# Patient Record
Sex: Male | Born: 1945 | Race: White | Hispanic: No | Marital: Married | Smoking: Former smoker
Health system: Southern US, Community
[De-identification: ages and names within clinical notes are randomized; demographics above are authoritative.]

## PROBLEM LIST (undated history)

## (undated) DIAGNOSIS — I10 Essential (primary) hypertension: Secondary | ICD-10-CM

## (undated) DIAGNOSIS — H269 Unspecified cataract: Secondary | ICD-10-CM

## (undated) DIAGNOSIS — F32A Depression, unspecified: Secondary | ICD-10-CM

## (undated) DIAGNOSIS — K219 Gastro-esophageal reflux disease without esophagitis: Secondary | ICD-10-CM

## (undated) DIAGNOSIS — J302 Other seasonal allergic rhinitis: Secondary | ICD-10-CM

## (undated) DIAGNOSIS — N189 Chronic kidney disease, unspecified: Secondary | ICD-10-CM

## (undated) DIAGNOSIS — N400 Enlarged prostate without lower urinary tract symptoms: Secondary | ICD-10-CM

## (undated) DIAGNOSIS — E785 Hyperlipidemia, unspecified: Secondary | ICD-10-CM

## (undated) DIAGNOSIS — F39 Unspecified mood [affective] disorder: Secondary | ICD-10-CM

## (undated) DIAGNOSIS — M1990 Unspecified osteoarthritis, unspecified site: Secondary | ICD-10-CM

## (undated) HISTORY — DX: Unspecified mood (affective) disorder: F39

## (undated) HISTORY — DX: Unspecified cataract: H26.9

## (undated) HISTORY — PX: TONSILLECTOMY: SUR1361

## (undated) HISTORY — PX: SHOULDER INJECTION: SHX5048

## (undated) HISTORY — DX: Hyperlipidemia, unspecified: E78.5

## (undated) HISTORY — DX: Benign prostatic hyperplasia without lower urinary tract symptoms: N40.0

## (undated) HISTORY — DX: Other seasonal allergic rhinitis: J30.2

## (undated) HISTORY — DX: Gastro-esophageal reflux disease without esophagitis: K21.9

## (undated) HISTORY — DX: Unspecified osteoarthritis, unspecified site: M19.90

## (undated) HISTORY — DX: Depression, unspecified: F32.A

## (undated) HISTORY — DX: Chronic kidney disease, unspecified: N18.9

---
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
## 2011-12-23 HISTORY — PX: COLONOSCOPY: SHX174

## 2014-07-11 DIAGNOSIS — H2513 Age-related nuclear cataract, bilateral: Secondary | ICD-10-CM | POA: Insufficient documentation

## 2015-08-02 DIAGNOSIS — Z23 Encounter for immunization: Secondary | ICD-10-CM | POA: Diagnosis not present

## 2015-10-04 DIAGNOSIS — H5203 Hypermetropia, bilateral: Secondary | ICD-10-CM | POA: Diagnosis not present

## 2015-10-04 DIAGNOSIS — H2513 Age-related nuclear cataract, bilateral: Secondary | ICD-10-CM | POA: Diagnosis not present

## 2015-10-04 DIAGNOSIS — H52203 Unspecified astigmatism, bilateral: Secondary | ICD-10-CM | POA: Diagnosis not present

## 2015-12-21 DIAGNOSIS — E7800 Pure hypercholesterolemia, unspecified: Secondary | ICD-10-CM | POA: Diagnosis not present

## 2015-12-21 DIAGNOSIS — K228 Other specified diseases of esophagus: Secondary | ICD-10-CM | POA: Diagnosis not present

## 2015-12-21 DIAGNOSIS — Z0000 Encounter for general adult medical examination without abnormal findings: Secondary | ICD-10-CM | POA: Diagnosis not present

## 2015-12-21 DIAGNOSIS — I10 Essential (primary) hypertension: Secondary | ICD-10-CM | POA: Diagnosis not present

## 2015-12-31 DIAGNOSIS — D225 Melanocytic nevi of trunk: Secondary | ICD-10-CM | POA: Diagnosis not present

## 2015-12-31 DIAGNOSIS — L821 Other seborrheic keratosis: Secondary | ICD-10-CM | POA: Diagnosis not present

## 2015-12-31 DIAGNOSIS — L812 Freckles: Secondary | ICD-10-CM | POA: Diagnosis not present

## 2015-12-31 DIAGNOSIS — D1801 Hemangioma of skin and subcutaneous tissue: Secondary | ICD-10-CM | POA: Diagnosis not present

## 2016-03-02 DIAGNOSIS — Z125 Encounter for screening for malignant neoplasm of prostate: Secondary | ICD-10-CM | POA: Diagnosis not present

## 2016-03-02 DIAGNOSIS — I10 Essential (primary) hypertension: Secondary | ICD-10-CM | POA: Diagnosis not present

## 2016-03-02 DIAGNOSIS — E784 Other hyperlipidemia: Secondary | ICD-10-CM | POA: Diagnosis not present

## 2016-03-05 DIAGNOSIS — Z0000 Encounter for general adult medical examination without abnormal findings: Secondary | ICD-10-CM | POA: Diagnosis not present

## 2016-03-05 DIAGNOSIS — M25552 Pain in left hip: Secondary | ICD-10-CM | POA: Diagnosis not present

## 2016-03-05 DIAGNOSIS — R809 Proteinuria, unspecified: Secondary | ICD-10-CM | POA: Diagnosis not present

## 2016-03-05 DIAGNOSIS — I10 Essential (primary) hypertension: Secondary | ICD-10-CM | POA: Diagnosis not present

## 2016-03-05 DIAGNOSIS — Z1389 Encounter for screening for other disorder: Secondary | ICD-10-CM | POA: Diagnosis not present

## 2016-03-05 DIAGNOSIS — R784 Finding of other drugs of addictive potential in blood: Secondary | ICD-10-CM | POA: Diagnosis not present

## 2016-03-05 DIAGNOSIS — G4700 Insomnia, unspecified: Secondary | ICD-10-CM | POA: Diagnosis not present

## 2016-03-05 DIAGNOSIS — E669 Obesity, unspecified: Secondary | ICD-10-CM | POA: Diagnosis not present

## 2016-03-05 DIAGNOSIS — R69 Illness, unspecified: Secondary | ICD-10-CM | POA: Diagnosis not present

## 2016-03-05 DIAGNOSIS — K219 Gastro-esophageal reflux disease without esophagitis: Secondary | ICD-10-CM | POA: Diagnosis not present

## 2016-05-07 DIAGNOSIS — R69 Illness, unspecified: Secondary | ICD-10-CM | POA: Diagnosis not present

## 2016-05-07 DIAGNOSIS — H353191 Nonexudative age-related macular degeneration, unspecified eye, early dry stage: Secondary | ICD-10-CM | POA: Diagnosis not present

## 2016-05-07 DIAGNOSIS — M25551 Pain in right hip: Secondary | ICD-10-CM | POA: Diagnosis not present

## 2016-06-30 DIAGNOSIS — R69 Illness, unspecified: Secondary | ICD-10-CM | POA: Diagnosis not present

## 2016-10-27 ENCOUNTER — Emergency Department (HOSPITAL_COMMUNITY): Payer: Medicare HMO

## 2016-10-27 ENCOUNTER — Encounter (HOSPITAL_COMMUNITY): Payer: Self-pay | Admitting: Emergency Medicine

## 2016-10-27 ENCOUNTER — Emergency Department (HOSPITAL_COMMUNITY)
Admission: EM | Admit: 2016-10-27 | Discharge: 2016-10-27 | Disposition: A | Discharge status: Home / Self Care | Payer: Medicare HMO | Attending: Emergency Medicine | Admitting: Emergency Medicine

## 2016-10-27 DIAGNOSIS — R1010 Upper abdominal pain, unspecified: Secondary | ICD-10-CM

## 2016-10-27 DIAGNOSIS — R1011 Right upper quadrant pain: Secondary | ICD-10-CM | POA: Diagnosis not present

## 2016-10-27 DIAGNOSIS — Z87891 Personal history of nicotine dependence: Secondary | ICD-10-CM | POA: Diagnosis not present

## 2016-10-27 DIAGNOSIS — R1030 Lower abdominal pain, unspecified: Secondary | ICD-10-CM | POA: Diagnosis present

## 2016-10-27 DIAGNOSIS — I10 Essential (primary) hypertension: Secondary | ICD-10-CM | POA: Diagnosis not present

## 2016-10-27 DIAGNOSIS — R1012 Left upper quadrant pain: Secondary | ICD-10-CM | POA: Diagnosis not present

## 2016-10-27 DIAGNOSIS — K5790 Diverticulosis of intestine, part unspecified, without perforation or abscess without bleeding: Secondary | ICD-10-CM | POA: Diagnosis not present

## 2016-10-27 HISTORY — DX: Essential (primary) hypertension: I10

## 2016-10-27 LAB — COMPREHENSIVE METABOLIC PANEL
ALBUMIN: 4.4 g/dL (ref 3.5–5.0)
ALK PHOS: 93 U/L (ref 38–126)
ALT: 46 U/L (ref 17–63)
AST: 26 U/L (ref 15–41)
Anion gap: 8 (ref 5–15)
BUN: 18 mg/dL (ref 6–20)
CO2: 26 mmol/L (ref 22–32)
Calcium: 9.5 mg/dL (ref 8.9–10.3)
Chloride: 104 mmol/L (ref 101–111)
Creatinine, Ser: 1.23 mg/dL (ref 0.61–1.24)
GFR calc Af Amer: >60 mL/min (ref >60)
GFR calc non Af Amer: 57 mL/min — ABNORMAL LOW (ref >60)
GLUCOSE: 101 mg/dL — AB (ref 65–99)
POTASSIUM: 4.2 mmol/L (ref 3.5–5.1)
SODIUM: 138 mmol/L (ref 135–145)
Total Bilirubin: 0.6 mg/dL (ref 0.3–1.2)
Total Protein: 7.5 g/dL (ref 6.5–8.1)

## 2016-10-27 LAB — CBC WITH DIFFERENTIAL/PLATELET
BASOS ABS: 0 10*3/uL (ref 0.0–0.1)
BASOS PCT: 0 %
EOS ABS: 0.2 10*3/uL (ref 0.0–0.7)
Eosinophils Relative: 2 %
HCT: 44.3 % (ref 39.0–52.0)
HEMOGLOBIN: 15.3 g/dL (ref 13.0–17.0)
Lymphocytes Relative: 22 %
Lymphs Abs: 1.9 10*3/uL (ref 0.7–4.0)
MCH: 30.7 pg (ref 26.0–34.0)
MCHC: 34.5 g/dL (ref 30.0–36.0)
MCV: 89 fL (ref 78.0–100.0)
MONOS PCT: 7 %
Monocytes Absolute: 0.6 10*3/uL (ref 0.1–1.0)
Neutro Abs: 5.9 10*3/uL (ref 1.7–7.7)
Neutrophils Relative %: 69 %
Platelets: 252 10*3/uL (ref 150–400)
RBC: 4.98 MIL/uL (ref 4.22–5.81)
RDW: 13.6 % (ref 11.5–15.5)
WBC: 8.5 10*3/uL (ref 4.0–10.5)

## 2016-10-27 LAB — LIPASE, BLOOD: Lipase: 27 U/L (ref 11–51)

## 2016-10-27 LAB — URINALYSIS, ROUTINE W REFLEX MICROSCOPIC
Bilirubin Urine: NEGATIVE
Glucose, UA: NEGATIVE mg/dL
Hgb urine dipstick: NEGATIVE
Ketones, ur: NEGATIVE mg/dL
LEUKOCYTES UA: NEGATIVE
Nitrite: NEGATIVE
PH: 5 (ref 5.0–8.0)
Protein, ur: NEGATIVE mg/dL
SPECIFIC GRAVITY, URINE: 1.009 (ref 1.005–1.030)

## 2016-10-27 LAB — I-STAT CHEM 8, ED
BUN: 20 mg/dL (ref 6–20)
CHLORIDE: 103 mmol/L (ref 101–111)
CREATININE: 1.2 mg/dL (ref 0.61–1.24)
Calcium, Ion: 1.15 mmol/L (ref 1.15–1.40)
Glucose, Bld: 101 mg/dL — ABNORMAL HIGH (ref 65–99)
HEMATOCRIT: 48 % (ref 39.0–52.0)
HEMOGLOBIN: 16.3 g/dL (ref 13.0–17.0)
POTASSIUM: 4.2 mmol/L (ref 3.5–5.1)
Sodium: 140 mmol/L (ref 135–145)
TCO2: 27 mmol/L (ref 0–100)

## 2016-10-27 MED ORDER — IOPAMIDOL (ISOVUE-300) INJECTION 61%
INTRAVENOUS | Status: AC
  Administered 2016-10-27: 100 mL via INTRAVENOUS
  Filled 2016-10-27: qty 100

## 2016-10-27 MED ORDER — IOPAMIDOL (ISOVUE-300) INJECTION 61%
100.0000 mL | Freq: Once | INTRAVENOUS | Status: AC | PRN
  Administered 2016-10-27: 100 mL via INTRAVENOUS

## 2016-10-27 MED ORDER — METOCLOPRAMIDE HCL 10 MG PO TABS
10.0000 mg | ORAL_TABLET | Freq: Once | ORAL | Status: AC
  Administered 2016-10-27: 10 mg via ORAL
  Filled 2016-10-27: qty 1

## 2016-10-27 MED ORDER — DICYCLOMINE HCL 10 MG PO CAPS
20.0000 mg | ORAL_CAPSULE | Freq: Once | ORAL | Status: AC
  Administered 2016-10-27: 20 mg via ORAL
  Filled 2016-10-27: qty 2

## 2016-10-27 NOTE — ED Triage Notes (Signed)
Pt states he is having abd pain that started last night  Pt states it is a dull, nagging, discomfort  Denies N/V/D  States it is worse when he lays down

## 2016-10-27 NOTE — ED Provider Notes (Signed)
[facility] DEPT Provider Note   CSN: [id number] Arrival date & time: 10/27/16  0343     History   Chief Complaint Chief Complaint  Patient presents with  . Abdominal Pain    HPI Bassel Rudisill is a 71 y.o. male with no significant past medical history presenting today with abdominal pain. Patient describes the pain as bilateral upper quadrants and a throbbing feeling.  There is no radiation to the pain. He has no nausea vomiting or diarrhea. He denies fevers. He has no sick contacts. He states he may have eaten some bad spaghetti last night. He denies any urinary symptoms. He took ranitidine and Pepto-Bismol prior to arrival without any relief of his symptoms. He has no further complaints.  10 Systems reviewed and are negative for acute change except as noted in the HPI.      HPI  Past Medical History:  Diagnosis Date  . Hypertension     There are no active problems to display for this patient.   Past Surgical History:  Procedure Laterality Date  . TONSILLECTOMY         Home Medications    Prior to Admission medications   Not on File    Family History History reviewed. No pertinent family history.  Social History Social History  Substance Use Topics  . Smoking status: Former Research scientist (life sciences)  . Smokeless tobacco: Never Used  . Alcohol use Yes     Comment: occ     Allergies   Patient has no known allergies.   Review of Systems Review of Systems   Physical Exam Updated Vital Signs BP 181/80 (BP Location: Left Arm)   Pulse 78   Temp 97.6 F (36.4 C) (Oral)   Resp 19   SpO2 98%   Physical Exam  Constitutional: He is oriented to person, place, and time. Vital signs are normal. He appears well-developed and well-nourished.  Non-toxic appearance. He does not appear ill. No distress.  HENT:  Head: Normocephalic and atraumatic.  Nose: Nose normal.  Mouth/Throat: Oropharynx is clear and moist. No oropharyngeal exudate.  Eyes: Conjunctivae and EOM are  normal. Pupils are equal, round, and reactive to light. No scleral icterus.  Neck: Normal range of motion. Neck supple. No tracheal deviation, no edema, no erythema and normal range of motion present. No thyroid mass and no thyromegaly present.  Cardiovascular: Normal rate, regular rhythm, S1 normal, S2 normal, normal heart sounds, intact distal pulses and normal pulses.  Exam reveals no gallop and no friction rub.   No murmur heard. Pulmonary/Chest: Effort normal and breath sounds normal. No respiratory distress. He has no wheezes. He has no rhonchi. He has no rales.  Abdominal: Soft. Normal appearance and bowel sounds are normal. He exhibits no distension, no ascites and no mass. There is no hepatosplenomegaly. There is no tenderness. There is no rebound, no guarding and no CVA tenderness.  Musculoskeletal: Normal range of motion. He exhibits no edema or tenderness.  Lymphadenopathy:    He has no cervical adenopathy.  Neurological: He is alert and oriented to person, place, and time. He has normal strength. No cranial nerve deficit or sensory deficit.  Skin: Skin is warm, dry and intact. No petechiae and no rash noted. He is not diaphoretic. No erythema. No pallor.  Nursing note and vitals reviewed.    ED Treatments / Results  Labs (all labs ordered are listed, but only abnormal results are displayed) Labs Reviewed  COMPREHENSIVE METABOLIC PANEL - Abnormal; Notable for the following:  Result Value   Glucose, Bld 101 (*)    GFR calc non Af Amer 57 (*)    All other components within normal limits  URINALYSIS, ROUTINE W REFLEX MICROSCOPIC - Abnormal; Notable for the following:    Color, Urine STRAW (*)    All other components within normal limits  I-STAT CHEM 8, ED - Abnormal; Notable for the following:    Glucose, Bld 101 (*)    All other components within normal limits  URINE CULTURE  CBC WITH DIFFERENTIAL/PLATELET  LIPASE, BLOOD    EKG  EKG Interpretation None        Radiology Ct Abdomen Pelvis W Contrast  Result Date: 10/27/2016 CLINICAL DATA:  All epigastric pain for several hours, positional. EXAM: CT ABDOMEN AND PELVIS WITH CONTRAST TECHNIQUE: Multidetector CT imaging of the abdomen and pelvis was performed using the standard protocol following bolus administration of intravenous contrast. CONTRAST:  100 mL Isovue-300 intravenous COMPARISON:  None. FINDINGS: Lower chest: No acute findings. 4.5 mm noncalcified nodule at the right middle lobe periphery, image 2 series 3. No consolidation or effusion. Hepatobiliary: No focal liver abnormality is seen. No gallstones, gallbladder wall thickening, or biliary dilatation. Pancreas: Unremarkable. No pancreatic ductal dilatation or surrounding inflammatory changes. Spleen: Normal in size without focal abnormality. Adrenals/Urinary Tract: Adrenal glands are unremarkable. Kidneys are normal, without renal calculi, focal lesion, or hydronephrosis. Bladder is unremarkable. Stomach/Bowel: Moderate hiatal hernia. Uncomplicated colonic diverticulosis. Small bowel and appendix are normal. Vascular/Lymphatic: The abdominal aorta is normal in caliber with moderate atherosclerotic calcification. Major branches of the aorta are patent. Reproductive: Unremarkable Other: No acute inflammatory changes.  No ascites. Musculoskeletal: No significant skeletal lesion. IMPRESSION: 1. No acute findings in the abdomen or pelvis. 2. Hiatal hernia 3. Uncomplicated diverticulosis 4. Aortic atherosclerosis. 5. Noncalcified 4.5 mm nodule at the periphery of the right middle lobe. No follow-up needed if patient is low-risk. Non-contrast chest CT can be considered in 12 months if patient is high-risk. This recommendation follows the consensus statement: Guidelines for Management of Incidental Pulmonary Nodules Detected on CT Images: From the Fleischner Society 2017; Radiology 2017; 284:228-243. Electronically Signed   By: Andreas Newport M.D.   On:  10/27/2016 06:15    Procedures Procedures (including critical care time)  Medications Ordered in ED Medications  dicyclomine (BENTYL) capsule 20 mg (20 mg Oral Given 10/27/16 0619)  metoCLOPramide (REGLAN) tablet 10 mg (10 mg Oral Given 10/27/16 0619)  iopamidol (ISOVUE-300) 61 % injection 100 mL (100 mLs Intravenous Contrast Given 10/27/16 0536)     Initial Impression / Assessment and Plan / ED Course  I have reviewed the triage vital signs and the nursing notes.  Pertinent labs & imaging results that were available during my care of the patient were reviewed by me and considered in my medical decision making (see chart for details).       Patient presents to the emergency department for abdominal pain. We'll obtain laboratory studies, CT scan for further evaluation. He was given Bentyl and Reglan. We'll continue to closely monitor.   Patient feels better after medications.  He has a GI doc he plans to call for fu.  He appears well and in NAD.  Labs and CT are unremarkable. He was told of incidental finding in lungs and need for fu.  VS remain within his normal limits and he is safe for DC   Final Clinical Impressions(s) / ED Diagnoses   Final diagnoses:  Pain of upper abdomen    New  Prescriptions New Prescriptions   No medications on file     Everlene Balls, MD 10/27/16 [phone]

## 2016-10-28 ENCOUNTER — Encounter: Payer: Self-pay | Admitting: Physician Assistant

## 2016-10-28 DIAGNOSIS — I10 Essential (primary) hypertension: Secondary | ICD-10-CM | POA: Diagnosis not present

## 2016-10-28 DIAGNOSIS — Z6831 Body mass index (BMI) 31.0-31.9, adult: Secondary | ICD-10-CM | POA: Diagnosis not present

## 2016-10-28 DIAGNOSIS — K219 Gastro-esophageal reflux disease without esophagitis: Secondary | ICD-10-CM | POA: Diagnosis not present

## 2016-10-28 LAB — URINE CULTURE

## 2016-11-02 DIAGNOSIS — I10 Essential (primary) hypertension: Secondary | ICD-10-CM | POA: Diagnosis not present

## 2016-11-02 DIAGNOSIS — Z6831 Body mass index (BMI) 31.0-31.9, adult: Secondary | ICD-10-CM | POA: Diagnosis not present

## 2016-11-02 DIAGNOSIS — B0229 Other postherpetic nervous system involvement: Secondary | ICD-10-CM | POA: Diagnosis not present

## 2016-11-09 ENCOUNTER — Ambulatory Visit: Payer: Medicare HMO | Admitting: Physician Assistant

## 2016-11-10 ENCOUNTER — Ambulatory Visit: Payer: Medicare HMO | Admitting: Physician Assistant

## 2016-12-08 DIAGNOSIS — R69 Illness, unspecified: Secondary | ICD-10-CM | POA: Diagnosis not present

## 2017-02-02 ENCOUNTER — Ambulatory Visit (INDEPENDENT_AMBULATORY_CARE_PROVIDER_SITE_OTHER): Payer: Medicare HMO

## 2017-02-02 ENCOUNTER — Ambulatory Visit (INDEPENDENT_AMBULATORY_CARE_PROVIDER_SITE_OTHER): Payer: Medicare HMO | Admitting: Physician Assistant

## 2017-02-02 DIAGNOSIS — S93411A Sprain of calcaneofibular ligament of right ankle, initial encounter: Secondary | ICD-10-CM

## 2017-02-02 NOTE — Progress Notes (Signed)
Office Visit Note   Patient: Michael Bishop           Date of Birth: March 27, 1946           MRN: [id number] Visit Date: 02/02/2017              Requested by: No referring provider defined for this encounter. PCP: System, Pcp Not In   Assessment & Plan: Visit Diagnoses:  1. Sprain of calcaneofibular ligament of right ankle, initial encounter     Plan: He will continue to use ice and ibuprofen. Relative rest. Did offer him an ASO brace he defers he will undergo and try to find a similar brace at the discount medical store. Like for him to wear the brace for the next 2 weeks whenever he is up ambulating. Then another week whenever he is outside ambulating. Operative appointment for follow-up in 2 weeks for recheck ascending call if he needed to be seen. Ask him to watch for increased mechanical symptoms and discussed these with him at length.  Follow-Up Instructions: Return if symptoms worsen or fail to improve.   Orders:  Orders Placed This Encounter  Procedures  . XR Ankle Complete Right   No orders of the defined types were placed in this encounter.     Procedures: No procedures performed   Clinical Data: No additional findings.   Subjective: No chief complaint on file.   HPI Mr. Veldhuizen is a 71 year old male were seen for right ankle pain. He seen Dr. Ninfa Linden in the past some 5 years ago for shoulder. He was leaving a friend's home yesterday when he turned back around to speak with him again and rolled his right ankle on the cement stoop. He denies any dizziness lightheadedness or other injury at the time. He is able to ambulate to his car and went actually shopping. Then once he got home he started developing some pain in the right ankle and swelling. He uses ibuprofen and ice which seemed to help. Review of Systems Negative for dizziness lightheadedness fevers chills. Please see history of present illness otherwise  Objective: Vital Signs: There were no vitals taken for  this visit.  Physical Exam  Constitutional: He is oriented to person, place, and time. He appears well-developed and well-nourished. No distress.  Neurological: He is alert and oriented to person, place, and time.  Psychiatric: He has a normal mood and affect. His behavior is normal.    Ortho Exam Right ankle has good dorsiflexion plantar flexion without pain. Good inversion eversion of the ankle without pain. Nontender over the posterior tibial tendon peroneal tendons. The Achilles is intact and nontender. Mild edema lateral aspect of the ankle. No ecchymosis erythema or cold or. Tenderness over the calcaneal fibular ligament only. Remainder of the ankles nontender. He has no tenderness over the proximal fibula. Right calf supple nontender. Dorsal pedal pulses present. Specialty Comments:  No specialty comments available.  Imaging: Xr Ankle Complete Right  Result Date: 02/02/2017 3 views right ankle: No acute fracture. Talus well located within the ankle mortise no diastases. No bony abnormalities otherwise.    PMFS History: There are no active problems to display for this patient.  Past Medical History:  Diagnosis Date  . Hypertension     No family history on file.  Past Surgical History:  Procedure Laterality Date  . TONSILLECTOMY     Social History   Occupational History  . Not on file.   Social History Main Topics  . Smoking  status: Former Research scientist (life sciences)  . Smokeless tobacco: Never Used  . Alcohol use Yes     Comment: occ  . Drug use: No  . Sexual activity: Not on file

## 2017-05-27 DIAGNOSIS — Z23 Encounter for immunization: Secondary | ICD-10-CM | POA: Diagnosis not present

## 2017-06-14 DIAGNOSIS — R69 Illness, unspecified: Secondary | ICD-10-CM | POA: Diagnosis not present

## 2017-09-03 DIAGNOSIS — I10 Essential (primary) hypertension: Secondary | ICD-10-CM | POA: Diagnosis not present

## 2017-09-03 DIAGNOSIS — R82998 Other abnormal findings in urine: Secondary | ICD-10-CM | POA: Diagnosis not present

## 2017-09-03 DIAGNOSIS — Z125 Encounter for screening for malignant neoplasm of prostate: Secondary | ICD-10-CM | POA: Diagnosis not present

## 2017-09-03 DIAGNOSIS — E7849 Other hyperlipidemia: Secondary | ICD-10-CM | POA: Diagnosis not present

## 2017-09-14 DIAGNOSIS — B0229 Other postherpetic nervous system involvement: Secondary | ICD-10-CM | POA: Diagnosis not present

## 2017-09-14 DIAGNOSIS — Z1389 Encounter for screening for other disorder: Secondary | ICD-10-CM | POA: Diagnosis not present

## 2017-09-14 DIAGNOSIS — G4709 Other insomnia: Secondary | ICD-10-CM | POA: Diagnosis not present

## 2017-09-14 DIAGNOSIS — I10 Essential (primary) hypertension: Secondary | ICD-10-CM | POA: Diagnosis not present

## 2017-09-14 DIAGNOSIS — R69 Illness, unspecified: Secondary | ICD-10-CM | POA: Diagnosis not present

## 2017-09-14 DIAGNOSIS — E7849 Other hyperlipidemia: Secondary | ICD-10-CM | POA: Diagnosis not present

## 2017-09-14 DIAGNOSIS — K219 Gastro-esophageal reflux disease without esophagitis: Secondary | ICD-10-CM | POA: Diagnosis not present

## 2017-09-14 DIAGNOSIS — K635 Polyp of colon: Secondary | ICD-10-CM | POA: Diagnosis not present

## 2017-09-14 DIAGNOSIS — E668 Other obesity: Secondary | ICD-10-CM | POA: Diagnosis not present

## 2017-09-14 DIAGNOSIS — Z0000 Encounter for general adult medical examination without abnormal findings: Secondary | ICD-10-CM | POA: Diagnosis not present

## 2017-10-19 DIAGNOSIS — Z6831 Body mass index (BMI) 31.0-31.9, adult: Secondary | ICD-10-CM | POA: Diagnosis not present

## 2017-10-19 DIAGNOSIS — I10 Essential (primary) hypertension: Secondary | ICD-10-CM | POA: Diagnosis not present

## 2017-11-23 ENCOUNTER — Encounter: Payer: Self-pay | Admitting: Internal Medicine

## 2017-12-20 DIAGNOSIS — R69 Illness, unspecified: Secondary | ICD-10-CM | POA: Diagnosis not present

## 2018-03-15 DIAGNOSIS — R69 Illness, unspecified: Secondary | ICD-10-CM | POA: Diagnosis not present

## 2018-03-15 DIAGNOSIS — E668 Other obesity: Secondary | ICD-10-CM | POA: Diagnosis not present

## 2018-03-15 DIAGNOSIS — Z6830 Body mass index (BMI) 30.0-30.9, adult: Secondary | ICD-10-CM | POA: Diagnosis not present

## 2018-03-15 DIAGNOSIS — K219 Gastro-esophageal reflux disease without esophagitis: Secondary | ICD-10-CM | POA: Diagnosis not present

## 2018-03-15 DIAGNOSIS — I10 Essential (primary) hypertension: Secondary | ICD-10-CM | POA: Diagnosis not present

## 2018-04-05 DIAGNOSIS — L821 Other seborrheic keratosis: Secondary | ICD-10-CM | POA: Diagnosis not present

## 2018-04-05 DIAGNOSIS — D2272 Melanocytic nevi of left lower limb, including hip: Secondary | ICD-10-CM | POA: Diagnosis not present

## 2018-04-05 DIAGNOSIS — D225 Melanocytic nevi of trunk: Secondary | ICD-10-CM | POA: Diagnosis not present

## 2018-04-05 DIAGNOSIS — L814 Other melanin hyperpigmentation: Secondary | ICD-10-CM | POA: Diagnosis not present

## 2018-04-05 DIAGNOSIS — D2372 Other benign neoplasm of skin of left lower limb, including hip: Secondary | ICD-10-CM | POA: Diagnosis not present

## 2018-04-05 DIAGNOSIS — D485 Neoplasm of uncertain behavior of skin: Secondary | ICD-10-CM | POA: Diagnosis not present

## 2018-05-17 DIAGNOSIS — I10 Essential (primary) hypertension: Secondary | ICD-10-CM | POA: Diagnosis not present

## 2018-05-17 DIAGNOSIS — Z23 Encounter for immunization: Secondary | ICD-10-CM | POA: Diagnosis not present

## 2018-05-17 DIAGNOSIS — Z6830 Body mass index (BMI) 30.0-30.9, adult: Secondary | ICD-10-CM | POA: Diagnosis not present

## 2018-06-02 ENCOUNTER — Ambulatory Visit: Payer: Medicare HMO | Admitting: Internal Medicine

## 2018-06-02 ENCOUNTER — Encounter: Payer: Self-pay | Admitting: Internal Medicine

## 2018-06-02 VITALS — BP 142/80 | HR 56 | Ht 70.0 in | Wt 217.8 lb

## 2018-06-02 DIAGNOSIS — E785 Hyperlipidemia, unspecified: Secondary | ICD-10-CM

## 2018-06-02 DIAGNOSIS — Z8249 Family history of ischemic heart disease and other diseases of the circulatory system: Secondary | ICD-10-CM | POA: Diagnosis not present

## 2018-06-02 DIAGNOSIS — I10 Essential (primary) hypertension: Secondary | ICD-10-CM | POA: Diagnosis not present

## 2018-06-02 NOTE — Patient Instructions (Signed)
Medication Instructions:  Continue current medications If you need a refill on your cardiac medications before your next appointment, please call your pharmacy.   Lab work: NONE  Testing/Procedures: Your physician has requested that you have a carotid duplex. This test is an ultrasound of the carotid arteries in your neck. It looks at blood flow through these arteries that supply the brain with blood. Allow one hour for this exam. There are no restrictions or special instructions. -- done @ Dr. Lysbeth Penner office  Follow-Up: as needed with Dr. Debara Pickett

## 2018-06-03 ENCOUNTER — Other Ambulatory Visit: Payer: Self-pay | Admitting: Internal Medicine

## 2018-06-03 DIAGNOSIS — R0989 Other specified symptoms and signs involving the circulatory and respiratory systems: Secondary | ICD-10-CM

## 2018-06-03 DIAGNOSIS — E785 Hyperlipidemia, unspecified: Secondary | ICD-10-CM

## 2018-06-03 DIAGNOSIS — Z8249 Family history of ischemic heart disease and other diseases of the circulatory system: Secondary | ICD-10-CM

## 2018-06-05 ENCOUNTER — Encounter: Payer: Self-pay | Admitting: Internal Medicine

## 2018-06-05 DIAGNOSIS — I10 Essential (primary) hypertension: Secondary | ICD-10-CM | POA: Insufficient documentation

## 2018-06-05 DIAGNOSIS — Z8249 Family history of ischemic heart disease and other diseases of the circulatory system: Secondary | ICD-10-CM | POA: Insufficient documentation

## 2018-06-05 DIAGNOSIS — E785 Hyperlipidemia, unspecified: Secondary | ICD-10-CM | POA: Insufficient documentation

## 2018-06-05 NOTE — Progress Notes (Signed)
OFFICE CONSULT NOTE  Chief Complaint:  Uncontrolled hypertension  Primary Care Physician: Prince Solian, MD  HPI:  Michael Bishop is a 72 y.o. male who is being seen today for the evaluation of uncontrolled hypertension at the request of Dr. Dagmar Hait. This is a 72 year old male with a history of hypertension and dyslipidemia as well as a family history of hypertension and his mother and father with hypertension and coronary artery disease status post CABG at age 58 who ultimately lived to age 77.  He also has a brother who had coronary artery bypass grafting at age 42.  He believes he is referred here for evaluation of uncontrolled hypertension.  Currently he is asymptomatic, denying any chest pain or worsening shortness of breath.  Blood pressure in the office today was 142/80.  Current medications include aspirin 81 mg daily, atorvastatin 80 mg nightly, telmisartan 80 mg daily he was recently switched to telmisartan from losartan, I believe for better duration of treatment.  He is concerned about persistent elevated diastolic blood pressure, however, it is again normal today.  PMHx:  Past Medical History:  Diagnosis Date  . Hypertension     Past Surgical History:  Procedure Laterality Date  . TONSILLECTOMY      FAMHx:  Family History  Problem Relation Age of Onset  . Hypertension Mother   . Hypertension Father   . Heart disease Father   . Heart disease Brother     SOCHx:   reports that he has quit smoking. He has never used smokeless tobacco. He reports that he drinks alcohol. He reports that he does not use drugs.  ALLERGIES:  No Known Allergies  ROS: Pertinent items noted in HPI and remainder of comprehensive ROS otherwise negative.  HOME MEDS: Current Outpatient Medications on File Prior to Visit  Medication Sig Dispense Refill  . Ascorbic Acid (VITAMIN C) 1000 MG tablet Take 1,000 mg by mouth.    Marland Kitchen aspirin 81 MG chewable tablet Chew 81 mg by mouth daily.     Marland Kitchen  atorvastatin (LIPITOR) 80 MG tablet     . Coenzyme Q10 100 MG capsule Take 100 mg by mouth.    . Multiple Vitamin (MULTIVITAMIN) capsule Take by mouth.    . telmisartan (MICARDIS) 80 MG tablet Take 80 mg by mouth daily.    . Vitamin D, Ergocalciferol, (DRISDOL) 50000 units CAPS capsule Take by mouth.     No current facility-administered medications on file prior to visit.     LABS/IMAGING: No results found for this or any previous visit (from the past 48 hour(s)). No results found.  LIPID PANEL: No results found for: CHOL, TRIG, HDL, CHOLHDL, VLDL, LDLCALC, LDLDIRECT  WEIGHTS: Wt Readings from Last 3 Encounters:  06/02/18 217 lb 12.8 oz (98.8 kg)    VITALS: BP (!) 142/80   Pulse (!) 56   Ht 5\' 10"  (1.778 m)   Wt 217 lb 12.8 oz (98.8 kg)   BMI 31.25 kg/m   EXAM: General appearance: alert and no distress Neck: no carotid bruit, no JVD and thyroid not enlarged, symmetric, no tenderness/mass/nodules Lungs: clear to auscultation bilaterally Heart: regular rate and rhythm, S1, S2 normal, no murmur, click, rub or gallop Abdomen: soft, non-tender; bowel sounds normal; no masses,  no organomegaly Extremities: extremities normal, atraumatic, no cyanosis or edema Pulses: 2+ and symmetric Skin: Skin color, texture, turgor normal. No rashes or lesions Neurologic: Grossly normal Psych: Pleasant  EKG: Sinus bradycardia 56- personally reviewed  ASSESSMENT: 1. Hypertension 2.  Dyslipidemia 3. Family history of coronary artery disease  PLAN: 1.   Mr. Angelillo has a history of hypertension, dyslipidemia and family history of coronary disease.  Recently was switched to telmisartan with improvement in blood pressure however he is not yet reach goal 120/80 or lower.  Blood pressure is much improved today and I recommended he continue to follow it at home and bring the readings into his PCPs office.  Should he need extra blood pressure control, he may consider donation telmisartan/HCTZ 80/12.5  mg daily.  He does have a family history of carotid artery disease.  Wishes to have screening carotid Dopplers today.  Although I did not detect bruit, this is a reasonable request.  We will go ahead and order that for him.  Plan follow-up with me as necessary.  Pixie Casino, MD, [facility], [place] Director of the Advanced Lipid Disorders &  Cardiovascular Risk Reduction Clinic Diplomate of the American Board of Clinical Lipidology Attending Cardiologist  Direct Dial: [phone]  Fax: [phone]  Website:  [email or web address] 06/05/2018, 4:00 PM

## 2018-06-06 ENCOUNTER — Ambulatory Visit (HOSPITAL_COMMUNITY)
Admission: RE | Admit: 2018-06-06 | Discharge: 2018-06-06 | Disposition: A | Discharge status: Home / Self Care | Payer: Medicare HMO | Source: Ambulatory Visit | Attending: Cardiovascular Disease | Admitting: Cardiovascular Disease

## 2018-06-06 DIAGNOSIS — Z8249 Family history of ischemic heart disease and other diseases of the circulatory system: Secondary | ICD-10-CM | POA: Insufficient documentation

## 2018-06-06 DIAGNOSIS — E785 Hyperlipidemia, unspecified: Secondary | ICD-10-CM | POA: Diagnosis not present

## 2018-06-06 DIAGNOSIS — R0989 Other specified symptoms and signs involving the circulatory and respiratory systems: Secondary | ICD-10-CM | POA: Insufficient documentation

## 2018-06-23 DIAGNOSIS — R69 Illness, unspecified: Secondary | ICD-10-CM | POA: Diagnosis not present

## 2018-07-13 IMAGING — CT CT ABD-PELV W/ CM
2 of 5 series · 16 of 46 positions shown, 18 images · IV contrast (ISOVUE)
Comparison: None.

CLINICAL DATA: All epigastric pain for several hours, positional.

EXAM:
CT ABDOMEN AND PELVIS WITH CONTRAST
TECHNIQUE: Multidetector CT imaging of the abdomen and pelvis was performed
using the standard protocol following bolus administration of
intravenous contrast.
CONTRAST:  100 mL Wsovue-5BB intravenous

[Series 2: abd/pel with · axial · 0.80mm/px · z∈[-562,-146]mm · 13 of 95 slices shown, 15 images]
[im 6/95  soft-tissue]
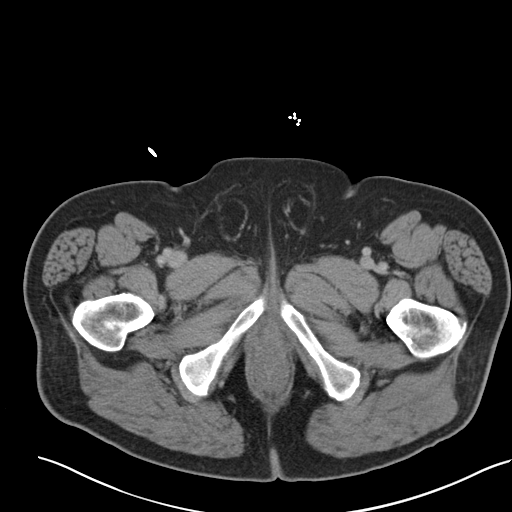
[im 6/95  bone]
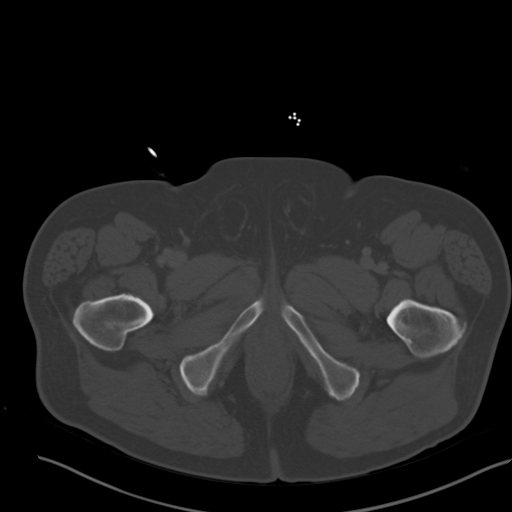
[im 12/95  soft-tissue]
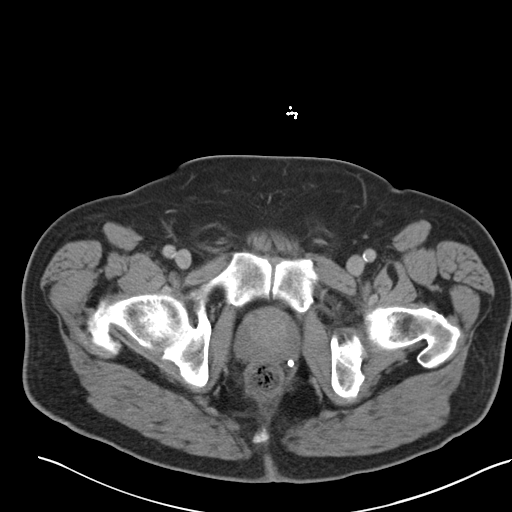
[im 18/95  soft-tissue]
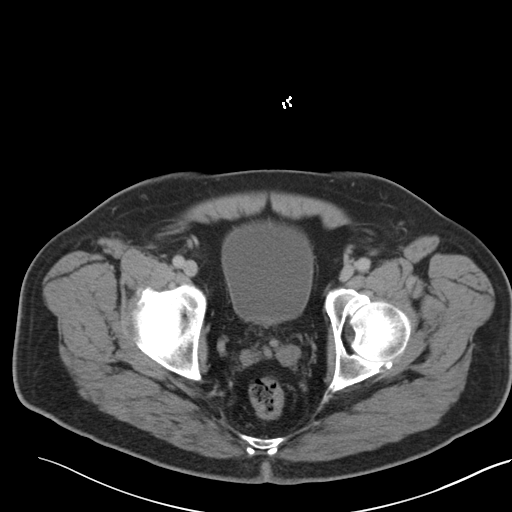
[im 30/95  soft-tissue]
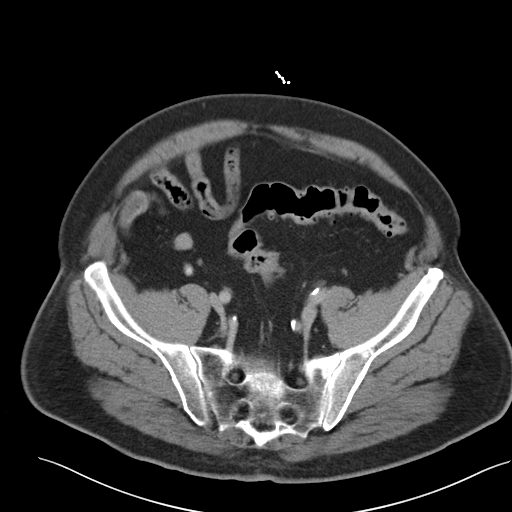
[im 36/95  soft-tissue]
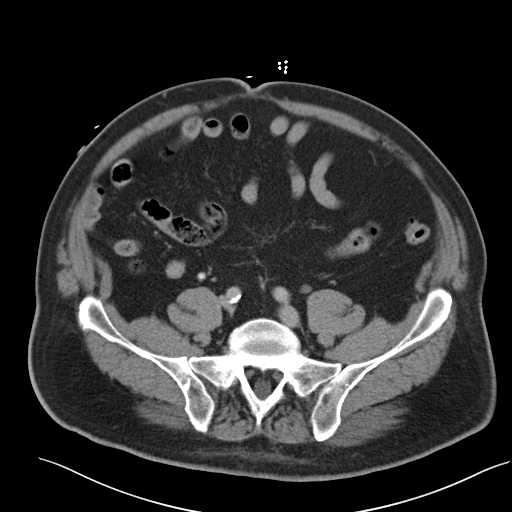
[im 42/95  soft-tissue]
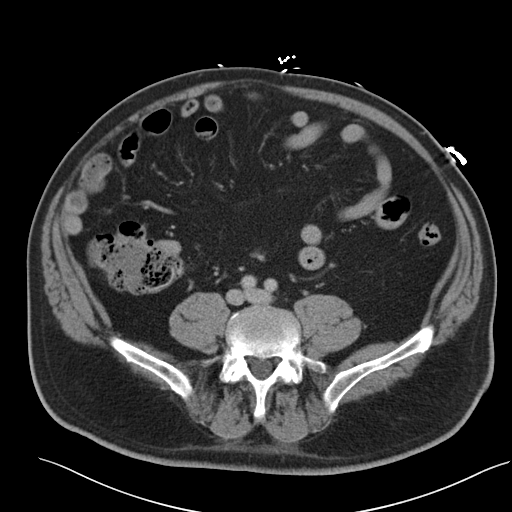
[im 48/95  soft-tissue]
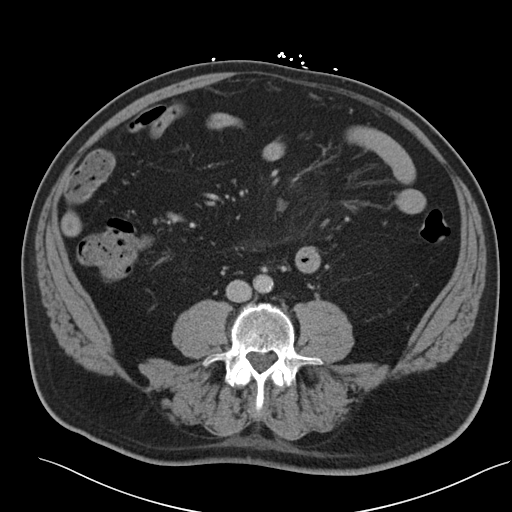
[im 53/95  soft-tissue]
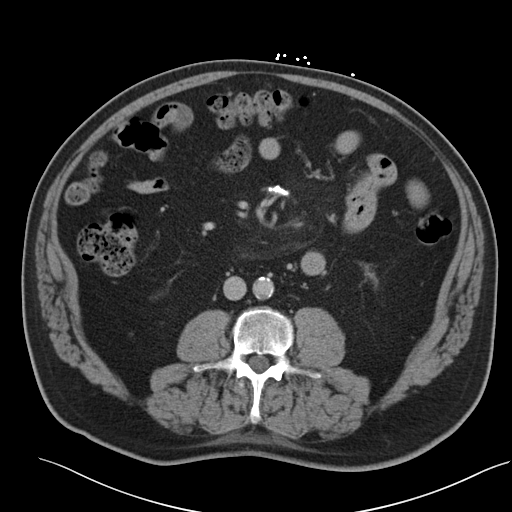
[im 59/95  soft-tissue]
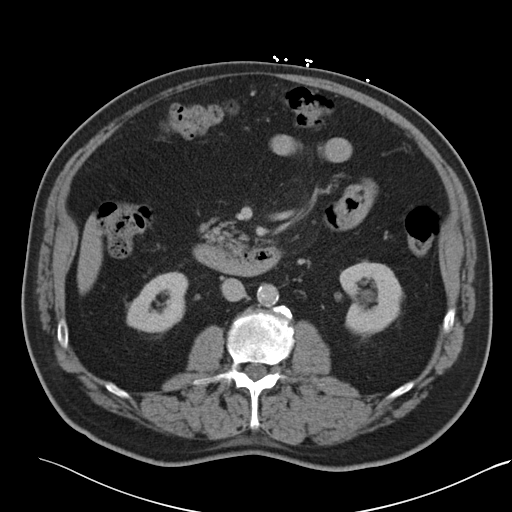
[im 59/95  bone]
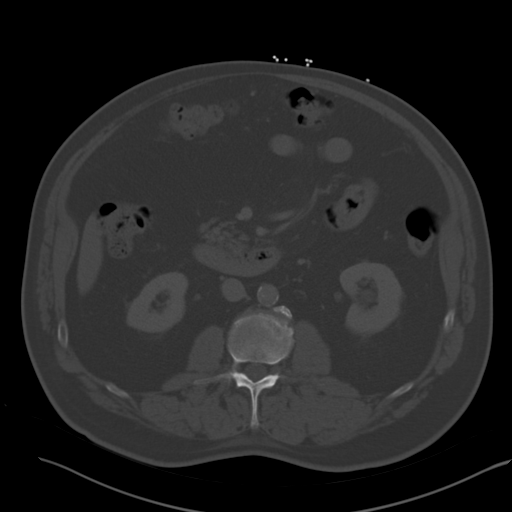
[im 65/95  soft-tissue]
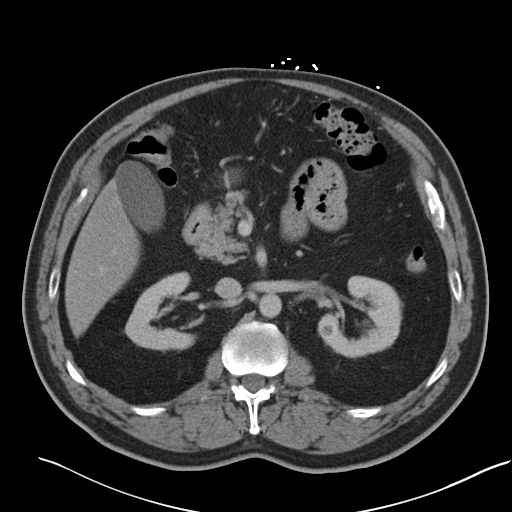
[im 77/95  soft-tissue]
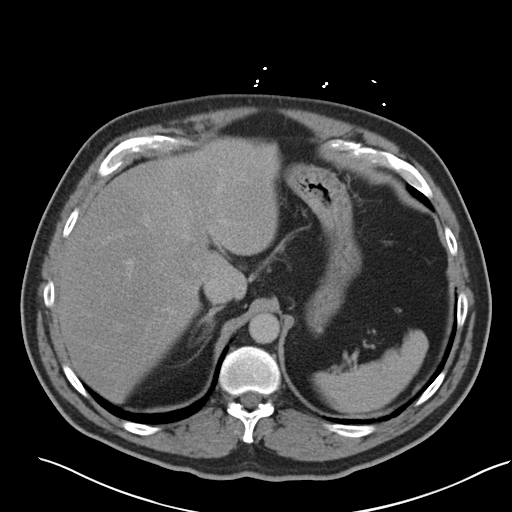
[im 83/95  soft-tissue]
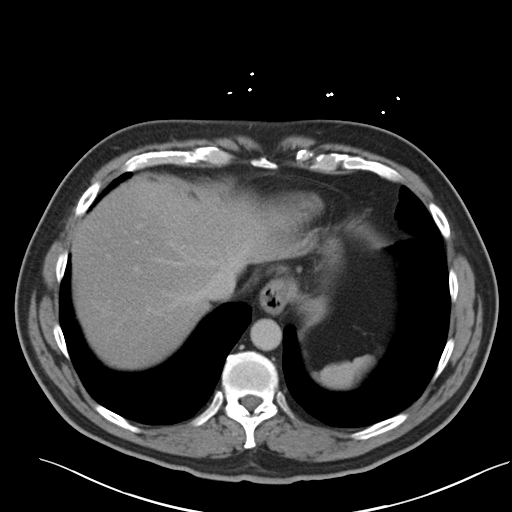
[im 89/95  soft-tissue]
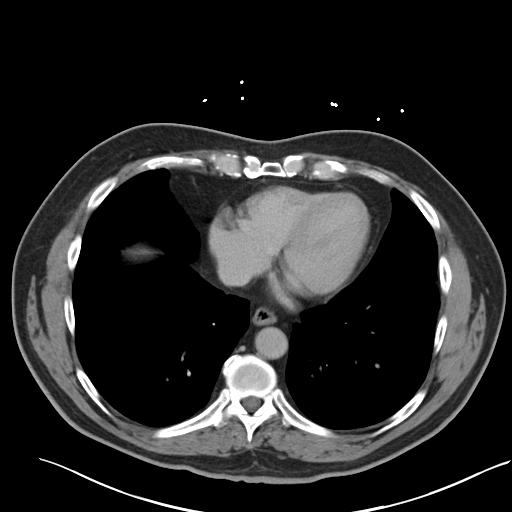

[Series 4: coronal a/|p · coronal · 0.74mm/px · 3 of 161 slices shown]
[im 54/161  soft-tissue]
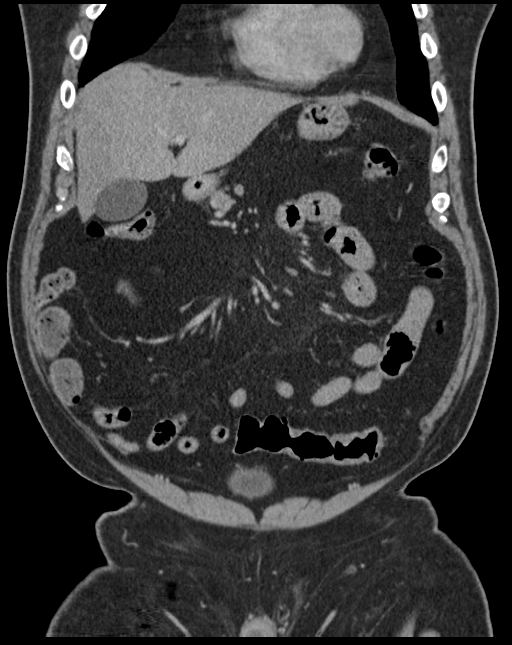
[im 72/161  soft-tissue]
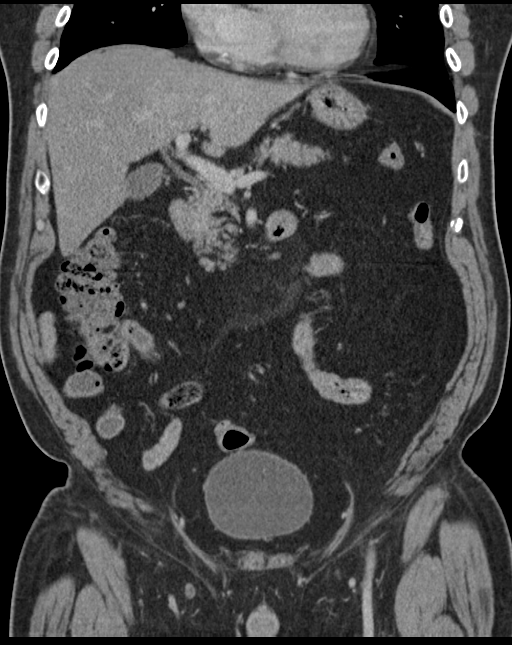
[im 89/161  soft-tissue]
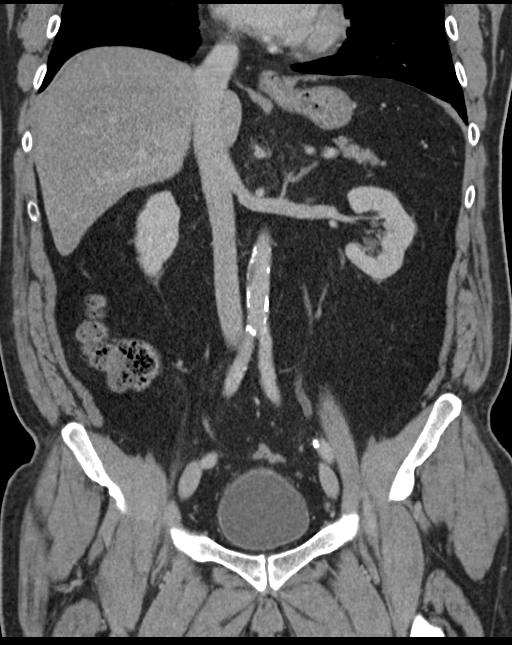

[16 of 46 positions shown; findings below may reference images not displayed]

FINDINGS: Lower chest: No acute findings. 4.5 mm noncalcified nodule at the
right middle lobe periphery, image 2 series 3. No consolidation or
effusion.

Hepatobiliary: No focal liver abnormality is seen. No gallstones,
gallbladder wall thickening, or biliary dilatation.

Pancreas: Unremarkable. No pancreatic ductal dilatation or
surrounding inflammatory changes.

Spleen: Normal in size without focal abnormality.

Adrenals/Urinary Tract: Adrenal glands are unremarkable. Kidneys are
normal, without renal calculi, focal lesion, or hydronephrosis.
Bladder is unremarkable.

Stomach/Bowel: Moderate hiatal hernia. Uncomplicated colonic
diverticulosis. Small bowel and appendix are normal.

Vascular/Lymphatic: The abdominal aorta is normal in caliber with
moderate atherosclerotic calcification. Major branches of the aorta
are patent.

Reproductive: Unremarkable

Other: No acute inflammatory changes.  No ascites.

Musculoskeletal: No significant skeletal lesion.
IMPRESSION: 1. No acute findings in the abdomen or pelvis.
2. Hiatal hernia
3. Uncomplicated diverticulosis
4. Aortic atherosclerosis.
5. Noncalcified 4.5 mm nodule at the periphery of the right middle
lobe. No follow-up needed if patient is low-risk. Non-contrast chest
CT can be considered in 12 months if patient is high-risk. This
recommendation follows the consensus statement: Guidelines for
Management of Incidental Pulmonary Nodules Detected on CT Images:

## 2018-09-22 DIAGNOSIS — R82998 Other abnormal findings in urine: Secondary | ICD-10-CM | POA: Diagnosis not present

## 2018-09-23 DIAGNOSIS — E7849 Other hyperlipidemia: Secondary | ICD-10-CM | POA: Diagnosis not present

## 2018-09-23 DIAGNOSIS — I10 Essential (primary) hypertension: Secondary | ICD-10-CM | POA: Diagnosis not present

## 2018-09-23 DIAGNOSIS — Z125 Encounter for screening for malignant neoplasm of prostate: Secondary | ICD-10-CM | POA: Diagnosis not present

## 2018-09-29 DIAGNOSIS — K219 Gastro-esophageal reflux disease without esophagitis: Secondary | ICD-10-CM | POA: Diagnosis not present

## 2018-09-29 DIAGNOSIS — E7849 Other hyperlipidemia: Secondary | ICD-10-CM | POA: Diagnosis not present

## 2018-09-29 DIAGNOSIS — M542 Cervicalgia: Secondary | ICD-10-CM | POA: Diagnosis not present

## 2018-09-29 DIAGNOSIS — Z0000 Encounter for general adult medical examination without abnormal findings: Secondary | ICD-10-CM | POA: Diagnosis not present

## 2018-09-29 DIAGNOSIS — E668 Other obesity: Secondary | ICD-10-CM | POA: Diagnosis not present

## 2018-09-29 DIAGNOSIS — R808 Other proteinuria: Secondary | ICD-10-CM | POA: Diagnosis not present

## 2018-09-29 DIAGNOSIS — R69 Illness, unspecified: Secondary | ICD-10-CM | POA: Diagnosis not present

## 2018-09-29 DIAGNOSIS — G4709 Other insomnia: Secondary | ICD-10-CM | POA: Diagnosis not present

## 2018-09-29 DIAGNOSIS — I10 Essential (primary) hypertension: Secondary | ICD-10-CM | POA: Diagnosis not present

## 2018-09-29 DIAGNOSIS — N183 Chronic kidney disease, stage 3 (moderate): Secondary | ICD-10-CM | POA: Diagnosis not present

## 2018-11-01 DIAGNOSIS — I10 Essential (primary) hypertension: Secondary | ICD-10-CM | POA: Diagnosis not present

## 2018-11-03 DIAGNOSIS — I10 Essential (primary) hypertension: Secondary | ICD-10-CM | POA: Diagnosis not present

## 2019-02-16 DIAGNOSIS — R69 Illness, unspecified: Secondary | ICD-10-CM | POA: Diagnosis not present

## 2019-03-30 DIAGNOSIS — R69 Illness, unspecified: Secondary | ICD-10-CM | POA: Diagnosis not present

## 2019-03-30 DIAGNOSIS — K219 Gastro-esophageal reflux disease without esophagitis: Secondary | ICD-10-CM | POA: Diagnosis not present

## 2019-03-30 DIAGNOSIS — N183 Chronic kidney disease, stage 3 (moderate): Secondary | ICD-10-CM | POA: Diagnosis not present

## 2019-03-30 DIAGNOSIS — I129 Hypertensive chronic kidney disease with stage 1 through stage 4 chronic kidney disease, or unspecified chronic kidney disease: Secondary | ICD-10-CM | POA: Diagnosis not present

## 2019-03-30 DIAGNOSIS — M62838 Other muscle spasm: Secondary | ICD-10-CM | POA: Diagnosis not present

## 2019-03-30 DIAGNOSIS — Z1331 Encounter for screening for depression: Secondary | ICD-10-CM | POA: Diagnosis not present

## 2019-03-30 DIAGNOSIS — R809 Proteinuria, unspecified: Secondary | ICD-10-CM | POA: Diagnosis not present

## 2019-03-30 DIAGNOSIS — E669 Obesity, unspecified: Secondary | ICD-10-CM | POA: Diagnosis not present

## 2019-04-17 DIAGNOSIS — H02831 Dermatochalasis of right upper eyelid: Secondary | ICD-10-CM | POA: Diagnosis not present

## 2019-04-17 DIAGNOSIS — H2513 Age-related nuclear cataract, bilateral: Secondary | ICD-10-CM | POA: Diagnosis not present

## 2019-04-17 DIAGNOSIS — H02834 Dermatochalasis of left upper eyelid: Secondary | ICD-10-CM | POA: Diagnosis not present

## 2019-04-28 DIAGNOSIS — H524 Presbyopia: Secondary | ICD-10-CM | POA: Diagnosis not present

## 2019-04-28 DIAGNOSIS — H52223 Regular astigmatism, bilateral: Secondary | ICD-10-CM | POA: Diagnosis not present

## 2019-06-12 ENCOUNTER — Other Ambulatory Visit: Payer: Self-pay | Admitting: Podiatry

## 2019-06-12 ENCOUNTER — Ambulatory Visit: Payer: Medicare HMO | Admitting: Podiatry

## 2019-06-12 ENCOUNTER — Ambulatory Visit (INDEPENDENT_AMBULATORY_CARE_PROVIDER_SITE_OTHER): Payer: Medicare HMO

## 2019-06-12 ENCOUNTER — Other Ambulatory Visit: Payer: Self-pay

## 2019-06-12 ENCOUNTER — Encounter: Payer: Self-pay | Admitting: Podiatry

## 2019-06-12 DIAGNOSIS — M216X1 Other acquired deformities of right foot: Secondary | ICD-10-CM

## 2019-06-12 DIAGNOSIS — M79671 Pain in right foot: Secondary | ICD-10-CM

## 2019-06-12 DIAGNOSIS — L84 Corns and callosities: Secondary | ICD-10-CM | POA: Diagnosis not present

## 2019-06-12 NOTE — Progress Notes (Signed)
Subjective:   Patient ID: Michael Bishop, male   DOB: 73 y.o.   MRN: [id number]   HPI Patient presents stating that he has had lesions on the bottom of his right foot which have been going on for around a year and are sore at different times.  Does not remember specific injury but states they have become more intense over time.  Patient does not smoke likes to be active   Review of Systems  All other systems reviewed and are negative.       Objective:  Physical Exam Vitals signs and nursing note reviewed.  Constitutional:      Appearance: He is well-developed.  Pulmonary:     Effort: Pulmonary effort is normal.  Musculoskeletal: Normal range of motion.  Skin:    General: Skin is warm.  Neurological:     Mental Status: He is alert.     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  Patient is encouraged to call with questions concerns  X-rays indicate that there is some hypertrophy of the bone structure but no indications of any advanced arthritis or stress fracture right

## 2019-06-23 DIAGNOSIS — R69 Illness, unspecified: Secondary | ICD-10-CM | POA: Diagnosis not present

## 2019-08-23 ENCOUNTER — Other Ambulatory Visit: Payer: Self-pay

## 2019-08-23 DIAGNOSIS — Z20822 Contact with and (suspected) exposure to covid-19: Secondary | ICD-10-CM

## 2019-08-23 DIAGNOSIS — Z20828 Contact with and (suspected) exposure to other viral communicable diseases: Secondary | ICD-10-CM | POA: Diagnosis not present

## 2019-08-24 LAB — NOVEL CORONAVIRUS, NAA: SARS-CoV-2, NAA: NOT DETECTED

## 2019-09-21 DIAGNOSIS — R69 Illness, unspecified: Secondary | ICD-10-CM | POA: Diagnosis not present

## 2019-09-22 ENCOUNTER — Ambulatory Visit: Payer: Medicare HMO

## 2019-09-27 ENCOUNTER — Ambulatory Visit: Payer: Medicare HMO

## 2019-09-28 ENCOUNTER — Ambulatory Visit: Payer: Medicare HMO | Attending: Internal Medicine

## 2019-09-28 DIAGNOSIS — Z23 Encounter for immunization: Secondary | ICD-10-CM | POA: Insufficient documentation

## 2019-09-28 NOTE — Progress Notes (Signed)
   Covid-19 Vaccination Clinic  Name:  Michael Bishop    MRN: [id number] DOB: 08/13/1946  09/28/2019  [name] was observed post Covid-19 immunization for 15 minutes without incidence. He was provided with Vaccine Information Sheet and instruction to access the V-Safe system.   [name] was instructed to call 911 with any severe reactions post vaccine: Marland Kitchen Difficulty breathing  . Swelling of your face and throat  . A fast heartbeat  . A bad rash all over your body  . Dizziness and weakness    Immunizations Administered    Name Date Dose VIS Date Route   Pfizer COVID-19 Vaccine 09/28/2019  2:55 PM 0.3 mL 08/04/2019 Intramuscular   Manufacturer: [place]   Lot: [id number]   [place]: [id number]

## 2019-10-23 ENCOUNTER — Ambulatory Visit: Payer: Medicare HMO | Attending: Internal Medicine

## 2019-10-23 DIAGNOSIS — Z23 Encounter for immunization: Secondary | ICD-10-CM

## 2019-10-23 NOTE — Progress Notes (Signed)
   Covid-19 Vaccination Clinic  Name:  Michael Bishop    MRN: [id number] DOB: Sep 28, 1945  10/23/2019  [name] was observed post Covid-19 immunization for 15 minutes without incidence. He was provided with Vaccine Information Sheet and instruction to access the V-Safe system.   [name] was instructed to call 911 with any severe reactions post vaccine: Marland Kitchen Difficulty breathing  . Swelling of your face and throat  . A fast heartbeat  . A bad rash all over your body  . Dizziness and weakness    Immunizations Administered    Name Date Dose VIS Date Route   Pfizer COVID-19 Vaccine 10/23/2019  5:15 PM 0.3 mL 08/04/2019 Intramuscular   Manufacturer: [place]   Lot: [id number]   [place]: [id number]

## 2019-12-13 DIAGNOSIS — H8110 Benign paroxysmal vertigo, unspecified ear: Secondary | ICD-10-CM | POA: Diagnosis not present

## 2019-12-13 DIAGNOSIS — R69 Illness, unspecified: Secondary | ICD-10-CM | POA: Diagnosis not present

## 2019-12-13 DIAGNOSIS — I129 Hypertensive chronic kidney disease with stage 1 through stage 4 chronic kidney disease, or unspecified chronic kidney disease: Secondary | ICD-10-CM | POA: Diagnosis not present

## 2019-12-13 DIAGNOSIS — N1831 Chronic kidney disease, stage 3a: Secondary | ICD-10-CM | POA: Diagnosis not present

## 2020-03-18 DIAGNOSIS — R69 Illness, unspecified: Secondary | ICD-10-CM | POA: Diagnosis not present

## 2020-03-27 DIAGNOSIS — Z008 Encounter for other general examination: Secondary | ICD-10-CM | POA: Diagnosis not present

## 2020-03-27 DIAGNOSIS — R69 Illness, unspecified: Secondary | ICD-10-CM | POA: Diagnosis not present

## 2020-03-27 DIAGNOSIS — I10 Essential (primary) hypertension: Secondary | ICD-10-CM | POA: Diagnosis not present

## 2020-03-27 DIAGNOSIS — Z87891 Personal history of nicotine dependence: Secondary | ICD-10-CM | POA: Diagnosis not present

## 2020-03-27 DIAGNOSIS — Z8249 Family history of ischemic heart disease and other diseases of the circulatory system: Secondary | ICD-10-CM | POA: Diagnosis not present

## 2020-03-27 DIAGNOSIS — E669 Obesity, unspecified: Secondary | ICD-10-CM | POA: Diagnosis not present

## 2020-03-27 DIAGNOSIS — Z6830 Body mass index (BMI) 30.0-30.9, adult: Secondary | ICD-10-CM | POA: Diagnosis not present

## 2020-03-27 DIAGNOSIS — Z7982 Long term (current) use of aspirin: Secondary | ICD-10-CM | POA: Diagnosis not present

## 2020-03-27 DIAGNOSIS — Z803 Family history of malignant neoplasm of breast: Secondary | ICD-10-CM | POA: Diagnosis not present

## 2020-03-27 DIAGNOSIS — E785 Hyperlipidemia, unspecified: Secondary | ICD-10-CM | POA: Diagnosis not present

## 2020-05-07 ENCOUNTER — Other Ambulatory Visit: Payer: Self-pay

## 2020-05-07 ENCOUNTER — Ambulatory Visit: Payer: Medicare HMO | Admitting: Internal Medicine

## 2020-05-07 ENCOUNTER — Encounter: Payer: Self-pay | Admitting: Internal Medicine

## 2020-05-07 VITALS — BP 148/80 | HR 59 | Ht 70.0 in | Wt 217.8 lb

## 2020-05-07 DIAGNOSIS — R0989 Other specified symptoms and signs involving the circulatory and respiratory systems: Secondary | ICD-10-CM | POA: Diagnosis not present

## 2020-05-07 DIAGNOSIS — I10 Essential (primary) hypertension: Secondary | ICD-10-CM | POA: Diagnosis not present

## 2020-05-07 DIAGNOSIS — R0602 Shortness of breath: Secondary | ICD-10-CM

## 2020-05-07 NOTE — Progress Notes (Signed)
OFFICE CONSULT NOTE  Chief Complaint:  Follow-up, dyspnea  Primary Care Physician: Prince Solian, MD  HPI:  Michael Bishop is a 74 y.o. male who is being seen today for the evaluation of uncontrolled hypertension at the request of Dr. Dagmar Hait. This is a 74 year old male with a history of hypertension and dyslipidemia as well as a family history of hypertension and his mother and father with hypertension and coronary artery disease status post CABG at age 24 who ultimately lived to age 51.  He also has a brother who had coronary artery bypass grafting at age 47.  He believes he is referred here for evaluation of uncontrolled hypertension.  Currently he is asymptomatic, denying any chest pain or worsening shortness of breath.  Blood pressure in the office today was 142/80.  Current medications include aspirin 81 mg daily, atorvastatin 80 mg nightly, telmisartan 80 mg daily he was recently switched to telmisartan from losartan, I believe for better duration of treatment.  He is concerned about persistent elevated diastolic blood pressure, however, it is again normal today.  05/07/2020  Michael Bishop returns today for follow-up.  In general his blood pressure was elevated today however home readings seem to be a little better than this.  He has reported some dyspnea and at times some fatigue.  He denies any positional dizziness, however there has been evidence for diastolic hypotension with diastolic blood pressures in the 40s and 50s at times at home.  Systolics are in the 751 range giving a wide pulse pressure.  Today in the office however the blood pressure was elevated but the pulse pressure was more normal.  PMHx:  Past Medical History:  Diagnosis Date  . Hypertension     Past Surgical History:  Procedure Laterality Date  . TONSILLECTOMY      FAMHx:  Family History  Problem Relation Age of Onset  . Hypertension Mother   . Hypertension Father   . Heart disease Father   . Heart disease  Brother     SOCHx:   reports that he has quit smoking. He has never used smokeless tobacco. He reports current alcohol use. He reports that he does not use drugs.  ALLERGIES:  No Known Allergies  ROS: Pertinent items noted in HPI and remainder of comprehensive ROS otherwise negative.  HOME MEDS: Current Outpatient Medications on File Prior to Visit  Medication Sig Dispense Refill  . Ascorbic Acid (VITAMIN C) 1000 MG tablet Take 1,000 mg by mouth.    Marland Kitchen aspirin 81 MG chewable tablet Chew 81 mg by mouth daily.     Marland Kitchen atorvastatin (LIPITOR) 80 MG tablet     . Coenzyme Q10 100 MG capsule Take 100 mg by mouth.    . Multiple Vitamin (MULTIVITAMIN) capsule Take by mouth.    . Omega-3 Fatty Acids (FISH OIL) 1000 MG CAPS Take 1 capsule by mouth. daily    . Specialty Vitamins Products (MAGNESIUM, AMINO ACID CHELATE,) 133 MG tablet Take by mouth daily. 1 tablet daily    . telmisartan (MICARDIS) 80 MG tablet Take 80 mg by mouth daily.    . Vitamin D, Ergocalciferol, (DRISDOL) 50000 units CAPS capsule Take by mouth.     No current facility-administered medications on file prior to visit.    LABS/IMAGING: No results found for this or any previous visit (from the past 48 hour(s)). No results found.  LIPID PANEL: No results found for: CHOL, TRIG, HDL, CHOLHDL, VLDL, LDLCALC, LDLDIRECT  WEIGHTS: Wt Readings from Last  3 Encounters:  05/07/20 217 lb 12.8 oz (98.8 kg)  06/02/18 217 lb 12.8 oz (98.8 kg)    VITALS: BP (!) 148/80   Pulse (!) 59   Ht 5\' 10"  (1.778 m)   Wt 217 lb 12.8 oz (98.8 kg)   SpO2 97%   BMI 31.25 kg/m   EXAM: General appearance: alert and no distress Neck: no carotid bruit, no JVD and thyroid not enlarged, symmetric, no tenderness/mass/nodules Lungs: clear to auscultation bilaterally Heart: regular rate and rhythm, S1, S2 normal, no murmur, click, rub or gallop Abdomen: soft, non-tender; bowel sounds normal; no masses,  no organomegaly Extremities: extremities  normal, atraumatic, no cyanosis or edema Pulses: 2+ and symmetric Skin: Skin color, texture, turgor normal. No rashes or lesions Neurologic: Grossly normal Psych: Pleasant  EKG: Sinus bradycardia, nonspecific ST and T wave changes at 59-personally reviewed  ASSESSMENT: 1. Dyspnea with low diastolic blood pressures 2. Hypertension 3. Dyslipidemia 4. Family history of coronary artery disease  PLAN: 1.   Michael Bishop has recently had some notable dyspnea with low diastolic blood pressures.  His daughter was concerned about cardiac causes of this such as ischemia although he does not have any anginal symptoms.  He has risk factors for this.  His wide pulse pressure could suggest valvular heart disease such as aortic insufficiency or cardiomyopathy.  I would like to get an echo to further evaluate this.  We may need to consider backing off a little on his medications due to some symptoms which could be related to low diastolic blood pressure.  Pixie Casino, MD, [facility], [place] Director of the Advanced Lipid Disorders &  Cardiovascular Risk Reduction Clinic Diplomate of the American Board of Clinical Lipidology Attending Cardiologist  Direct Dial: [phone]  Fax: [phone]  Website:  [email or web address] 05/07/2020, 2:24 PM

## 2020-05-07 NOTE — Patient Instructions (Signed)
Medication Instructions:  Your physician recommends that you continue on your current medications as directed. Please refer to the Current Medication list given to you today.  *If you need a refill on your cardiac medications before your next appointment, please call your pharmacy*   Testing/Procedures: Echocardiogram @ [address] - 3rd Floor   Follow-Up: At [facility], you and your health needs are our priority.  As part of our continuing mission to provide you with exceptional heart care, we have created designated Provider Care Teams.  These Care Teams include your primary Cardiologist (physician) and Advanced Practice Providers (APPs -  Physician Assistants and Nurse Practitioners) who all work together to provide you with the care you need, when you need it.  We recommend signing up for the patient portal called "MyChart".  Sign up information is provided on this After Visit Summary.  MyChart is used to connect with patients for Virtual Visits (Telemedicine).  Patients are able to view lab/test results, encounter notes, upcoming appointments, etc.  Non-urgent messages can be sent to your provider as well.   To learn more about what you can do with MyChart, go to [email or web address].    Your next appointment:   3-4 month(s)  The format for your next appointment:   In Person  Provider:   You may see Dr. Debara Pickett or one of the following Advanced Practice Providers on your designated Care Team:    Almyra Deforest, PA-C  Fabian Sharp, [place] or   Roby Lofts, [place]    Other Instructions

## 2020-05-16 ENCOUNTER — Ambulatory Visit (HOSPITAL_COMMUNITY): Payer: Medicare HMO | Attending: Internal Medicine

## 2020-05-16 ENCOUNTER — Other Ambulatory Visit: Payer: Self-pay

## 2020-05-16 DIAGNOSIS — R0602 Shortness of breath: Secondary | ICD-10-CM | POA: Insufficient documentation

## 2020-05-17 LAB — ECHOCARDIOGRAM COMPLETE
Area-P 1/2: 3.42 cm2
S' Lateral: 3.3 cm

## 2020-07-02 DIAGNOSIS — Z125 Encounter for screening for malignant neoplasm of prostate: Secondary | ICD-10-CM | POA: Diagnosis not present

## 2020-07-02 DIAGNOSIS — E785 Hyperlipidemia, unspecified: Secondary | ICD-10-CM | POA: Diagnosis not present

## 2020-07-09 DIAGNOSIS — K219 Gastro-esophageal reflux disease without esophagitis: Secondary | ICD-10-CM | POA: Diagnosis not present

## 2020-07-09 DIAGNOSIS — R809 Proteinuria, unspecified: Secondary | ICD-10-CM | POA: Diagnosis not present

## 2020-07-09 DIAGNOSIS — N1831 Chronic kidney disease, stage 3a: Secondary | ICD-10-CM | POA: Diagnosis not present

## 2020-07-09 DIAGNOSIS — I129 Hypertensive chronic kidney disease with stage 1 through stage 4 chronic kidney disease, or unspecified chronic kidney disease: Secondary | ICD-10-CM | POA: Diagnosis not present

## 2020-07-09 DIAGNOSIS — E785 Hyperlipidemia, unspecified: Secondary | ICD-10-CM | POA: Diagnosis not present

## 2020-07-09 DIAGNOSIS — Z0000 Encounter for general adult medical examination without abnormal findings: Secondary | ICD-10-CM | POA: Diagnosis not present

## 2020-07-09 DIAGNOSIS — R69 Illness, unspecified: Secondary | ICD-10-CM | POA: Diagnosis not present

## 2020-07-09 DIAGNOSIS — R82998 Other abnormal findings in urine: Secondary | ICD-10-CM | POA: Diagnosis not present

## 2020-07-09 DIAGNOSIS — I10 Essential (primary) hypertension: Secondary | ICD-10-CM | POA: Diagnosis not present

## 2020-07-09 DIAGNOSIS — G4700 Insomnia, unspecified: Secondary | ICD-10-CM | POA: Diagnosis not present

## 2020-07-09 DIAGNOSIS — E669 Obesity, unspecified: Secondary | ICD-10-CM | POA: Diagnosis not present

## 2020-07-09 DIAGNOSIS — K635 Polyp of colon: Secondary | ICD-10-CM | POA: Diagnosis not present

## 2020-08-05 DIAGNOSIS — H903 Sensorineural hearing loss, bilateral: Secondary | ICD-10-CM | POA: Diagnosis not present

## 2020-09-04 ENCOUNTER — Telehealth: Payer: Self-pay

## 2020-09-04 ENCOUNTER — Telehealth (INDEPENDENT_AMBULATORY_CARE_PROVIDER_SITE_OTHER): Payer: Medicare HMO | Admitting: Internal Medicine

## 2020-09-04 ENCOUNTER — Encounter: Payer: Self-pay | Admitting: Internal Medicine

## 2020-09-04 VITALS — BP 136/58 | HR 56 | Ht 70.0 in | Wt 216.0 lb

## 2020-09-04 DIAGNOSIS — R0989 Other specified symptoms and signs involving the circulatory and respiratory systems: Secondary | ICD-10-CM | POA: Diagnosis not present

## 2020-09-04 DIAGNOSIS — R0602 Shortness of breath: Secondary | ICD-10-CM

## 2020-09-04 DIAGNOSIS — R2681 Unsteadiness on feet: Secondary | ICD-10-CM | POA: Diagnosis not present

## 2020-09-04 DIAGNOSIS — I10 Essential (primary) hypertension: Secondary | ICD-10-CM | POA: Diagnosis not present

## 2020-09-04 MED ORDER — TELMISARTAN 40 MG PO TABS: 40.0000 mg | ORAL_TABLET | Freq: Every day | ORAL | 3 refills | Status: DC

## 2020-09-04 NOTE — Telephone Encounter (Signed)
Patient called to review e-visit instructions.  Patient aware that the following changes have been made: DECREASE TELMISARTAN TO 40mg  DAILY  Advised patient to monitor blood pressure daily at home and to call back to office with any issues or concerns.   Staff message sent to Scheduling for a 3 month follow up visit.   Patient verbalized understanding of all instructions.   No further assistance needed at this time.

## 2020-09-04 NOTE — Patient Instructions (Signed)
Medication Instructions:  DECREASE TELMISARTAN TO 40mg  DAILY- A NEW PRESCRIPTION HAS BEEN SENT IN FOR 40MG  TABLETS- PLEASE TAKE 1 TABLET DAILY  *If you need a refill on your cardiac medications before your next appointment, please call your pharmacy*  Follow-Up: At [facility], you and your health needs are our priority.  As part of our continuing mission to provide you with exceptional heart care, we have created designated Provider Care Teams.  These Care Teams include your primary Cardiologist (physician) and Advanced Practice Providers (APPs -  Physician Assistants and Nurse Practitioners) who all work together to provide you with the care you need, when you need it.  Your next appointment:   3 months   The format for your next appointment:   In Person  Provider:   K. Mali Hilty, MD  Other Instructions  PLEASE MONITOR BLOOD PRESSURE DAILY, AND WRITE THESE READINGS DOWN. PLEASE CALL THE OFFICE WITH ANY QUESTIONS OR CONCERNS [phone]

## 2020-09-04 NOTE — Progress Notes (Signed)
Virtual Visit via Video Note   This visit type was conducted due to national recommendations for restrictions regarding the COVID-19 Pandemic (e.g. social distancing) in an effort to limit this patient's exposure and mitigate transmission in our community.  Due to his co-morbid illnesses, this patient is at least at moderate risk for complications without adequate follow up.  This format is felt to be most appropriate for this patient at this time.  All issues noted in this document were discussed and addressed.  A limited physical exam was performed with this format.  Please refer to the patient's chart for his consent to telehealth for [facility].      Date:  09/04/2020   ID:  Michael Bishop, DOB 1946/03/31, MRN [id number] The patient was identified using 2 identifiers.  Evaluation Performed:  Follow-Up Visit  Patient Location:  [place] [address]  Provider location:   [address], [place], [place] [address]  PCP:  Prince Solian, MD  Cardiologist:  Pixie Casino, MD Electrophysiologist:  None   Chief Complaint:  Follow-up dizziness  History of Present Illness:    Michael Bishop is a 75 y.o. male who presents via audio/video conferencing for a telehealth visit today.  his is a 75 year old male with a history of hypertension and dyslipidemia as well as a family history of hypertension and his mother and father with hypertension and coronary artery disease status post CABG at age 70 who ultimately lived to age 85.  Michael Bishop also has a brother who had coronary artery bypass grafting at age 59.  Michael Bishop believes Michael Bishop is referred here for evaluation of uncontrolled hypertension.  Currently Michael Bishop is asymptomatic, denying any chest pain or worsening shortness of breath.  Blood pressure in the office today was 142/80.  Current medications include aspirin 81 mg daily, atorvastatin 80 mg nightly, telmisartan 80 mg daily Michael Bishop was recently switched to telmisartan from  losartan, I believe for better duration of treatment.  Michael Bishop is concerned about persistent elevated diastolic blood pressure, however, it is again normal today.  05/07/2020  Michael Bishop returns today for follow-up.  In general his blood pressure was elevated today however home readings seem to be a little better than this.  Michael Bishop has reported some dyspnea and at times some fatigue.  Michael Bishop denies any positional dizziness, however there has been evidence for diastolic hypotension with diastolic blood pressures in the 40s and 50s at times at home.  Systolics are in the [id number] range giving a wide pulse pressure.  Today in the office however the blood pressure was elevated but the pulse pressure was more normal.  09/04/2020  Michael Bishop returns today for video follow-up. Michael Bishop has had some recurrent episodes of unsteadiness and presyncope. Diastolic BP has been low, but is in the upper 50's today.   The patient does not have symptoms concerning for COVID-19 infection (fever, chills, cough, or new SHORTNESS OF BREATH).    Prior CV studies:   The following studies were reviewed today:  Chart reviewed  PMHx:  Past Medical History:  Diagnosis Date  . Hypertension     Past Surgical History:  Procedure Laterality Date  . TONSILLECTOMY      FAMHx:  Family History  Problem Relation Age of Onset  . Hypertension Mother   . Hypertension Father   . Heart disease Father   . Heart disease Brother     SOCHx:   reports that Michael Bishop has quit smoking. Michael Bishop has never  used smokeless tobacco. Michael Bishop reports current alcohol use. Michael Bishop reports that Michael Bishop does not use drugs.  ALLERGIES:  No Known Allergies  MEDS:  Current Meds  Medication Sig  . Ascorbic Acid (VITAMIN C) 1000 MG tablet Take 1,000 mg by mouth.  Marland Kitchen aspirin 81 MG chewable tablet Chew 81 mg by mouth daily.   Marland Kitchen atorvastatin (LIPITOR) 80 MG tablet   . Coenzyme Q10 100 MG capsule Take 100 mg by mouth.  . Multiple Vitamin (MULTIVITAMIN) capsule Take by mouth.  . Omega-3  Fatty Acids (FISH OIL) 1000 MG CAPS Take 1 capsule by mouth. daily  . Specialty Vitamins Products (MAGNESIUM, AMINO ACID CHELATE,) 133 MG tablet Take by mouth daily. 1 tablet daily  . Vitamin D, Ergocalciferol, (DRISDOL) 50000 units CAPS capsule Take by mouth.  . [DISCONTINUED] telmisartan (MICARDIS) 80 MG tablet Take 80 mg by mouth daily.     ROS: Pertinent items noted in HPI and remainder of comprehensive ROS otherwise negative.  Labs/Other Tests and Data Reviewed:    Recent Labs: No results found for requested labs within last 8760 hours.   Recent Lipid Panel No results found for: CHOL, TRIG, HDL, CHOLHDL, LDLCALC, LDLDIRECT  Wt Readings from Last 3 Encounters:  09/04/20 216 lb (98 kg)  05/07/20 217 lb 12.8 oz (98.8 kg)  06/02/18 217 lb 12.8 oz (98.8 kg)     Exam:    Vital Signs:  BP (!) 136/58   Pulse (!) 56   Ht 5\' 10"  (1.778 m)   Wt 216 lb (98 kg)   BMI 30.99 kg/m    General appearance: alert and no distress Lungs: no visual respiratory difficulty Abdomen: soft, non-tender; bowel sounds normal; no masses,  no organomegaly Extremities: extremities normal, atraumatic, no cyanosis or edema Skin: Skin color, texture, turgor normal. No rashes or lesions Neurologic: Mental status: Alert, oriented, thought content appropriate Psych: Pleasant  ASSESSMENT & PLAN:    1. Dyspnea with low diastolic blood pressures 2. Hypertension 3. Dyslipidemia 4. Family history of coronary artery disease 5. Unsteady gait  Michael Bishop has had a somewhat unsteady gait and perhaps some presyncope with complaints of shortness of breath however Michael Bishop says those have improved.  Michael Bishop had an echo which showed normal systolic and diastolic function.  It may be that Michael Bishop is on a little too much blood pressure medication.  I advised decreasing his telmisartan 80 to 40 mg daily.  Michael Bishop should continue to monitor blood pressures and for any changes in his symptoms.  COVID-19 Education: The signs and symptoms of  COVID-19 were discussed with the patient and how to seek care for testing (follow up with PCP or arrange E-visit).  The importance of social distancing was discussed today.  Patient Risk:   After full review of this patients clinical status, I feel that they are at least moderate risk at this time.  Time:   Today, I have spent 25 minutes with the patient with telehealth technology discussing hypertension, dyslipidemia, low diastolic blood pressure, unsteady gait.     Medication Adjustments/Labs and Tests Ordered: Current medicines are reviewed at length with the patient today.  Concerns regarding medicines are outlined above.   Tests Ordered: No orders of the defined types were placed in this encounter.   Medication Changes: Meds ordered this encounter  Medications  . telmisartan (MICARDIS) 40 MG tablet    Sig: Take 1 tablet (40 mg total) by mouth daily.    Dispense:  30 tablet    Refill:  3    Disposition:  in 6 month(s)  Pixie Casino, MD, [facility], [place] Director of the Advanced Lipid Disorders &  Cardiovascular Risk Reduction Clinic Diplomate of the American Board of Clinical Lipidology Attending Cardiologist  Direct Dial: [phone]  Fax: [phone]  Website:  [email or web address]  Pixie Casino, MD  09/04/2020 5:25 PM

## 2020-11-20 DIAGNOSIS — H524 Presbyopia: Secondary | ICD-10-CM | POA: Diagnosis not present

## 2020-11-20 DIAGNOSIS — H2513 Age-related nuclear cataract, bilateral: Secondary | ICD-10-CM | POA: Diagnosis not present

## 2020-11-20 DIAGNOSIS — H5203 Hypermetropia, bilateral: Secondary | ICD-10-CM | POA: Diagnosis not present

## 2020-11-20 DIAGNOSIS — H52203 Unspecified astigmatism, bilateral: Secondary | ICD-10-CM | POA: Diagnosis not present

## 2020-12-05 ENCOUNTER — Ambulatory Visit: Payer: Medicare HMO | Admitting: Internal Medicine

## 2021-01-08 ENCOUNTER — Ambulatory Visit (INDEPENDENT_AMBULATORY_CARE_PROVIDER_SITE_OTHER): Payer: Medicare HMO | Admitting: Internal Medicine

## 2021-01-08 ENCOUNTER — Encounter: Payer: Self-pay | Admitting: Internal Medicine

## 2021-01-08 ENCOUNTER — Other Ambulatory Visit: Payer: Self-pay

## 2021-01-08 VITALS — BP 122/62 | HR 53 | Ht 70.0 in | Wt 217.4 lb

## 2021-01-08 DIAGNOSIS — I10 Essential (primary) hypertension: Secondary | ICD-10-CM | POA: Diagnosis not present

## 2021-01-08 DIAGNOSIS — R0989 Other specified symptoms and signs involving the circulatory and respiratory systems: Secondary | ICD-10-CM

## 2021-01-08 DIAGNOSIS — R0602 Shortness of breath: Secondary | ICD-10-CM | POA: Diagnosis not present

## 2021-01-08 NOTE — Patient Instructions (Signed)

## 2021-01-08 NOTE — Progress Notes (Signed)
OFFICE CONSULT NOTE  Chief Complaint:  Follow-up  Primary Care Physician: Prince Solian, MD  HPI:  Michael Bishop is a 75 y.o. male who is being seen today for the evaluation of uncontrolled hypertension at the request of Dr. Dagmar Hait. This is a 75 year old male with a history of hypertension and dyslipidemia as well as a family history of hypertension and his mother and father with hypertension and coronary artery disease status post CABG at age 91 who ultimately lived to age 74.  He also has a brother who had coronary artery bypass grafting at age 74.  He believes he is referred here for evaluation of uncontrolled hypertension.  Currently he is asymptomatic, denying any chest pain or worsening shortness of breath.  Blood pressure in the office today was 142/80.  Current medications include aspirin 81 mg daily, atorvastatin 80 mg nightly, telmisartan 80 mg daily he was recently switched to telmisartan from losartan, I believe for better duration of treatment.  He is concerned about persistent elevated diastolic blood pressure, however, it is again normal today.  05/07/2020  Michael Bishop returns today for follow-up.  In general his blood pressure was elevated today however home readings seem to be a little better than this.  He has reported some dyspnea and at times some fatigue.  He denies any positional dizziness, however there has been evidence for diastolic hypotension with diastolic blood pressures in the 40s and 50s at times at home.  Systolics are in the 244 range giving a wide pulse pressure.  Today in the office however the blood pressure was elevated but the pulse pressure was more normal.  01/08/2021  Michael Bishop returns today for follow-up.  I saw him via virtual visit.  He was having some fatigue and shortness of breath.  He felt like his blood pressure was kind of low.  I recommended decreasing his telmisartan from 80 to 40 mg daily.  He said that change was effective for him.  Today blood  pressure is excellent 122/62.  Heart rates in the 50s.  He denies any shortness of breath or chest discomfort.  He said he was able to walk a long distance yesterday without any issues.  PMHx:  Past Medical History:  Diagnosis Date  . Hypertension     Past Surgical History:  Procedure Laterality Date  . TONSILLECTOMY      FAMHx:  Family History  Problem Relation Age of Onset  . Hypertension Mother   . Hypertension Father   . Heart disease Father   . Heart disease Brother     SOCHx:   reports that he has quit smoking. He has never used smokeless tobacco. He reports current alcohol use. He reports that he does not use drugs.  ALLERGIES:  No Known Allergies  ROS: Pertinent items noted in HPI and remainder of comprehensive ROS otherwise negative.  HOME MEDS: Current Outpatient Medications on File Prior to Visit  Medication Sig Dispense Refill  . Ascorbic Acid (VITAMIN C) 1000 MG tablet Take 1,000 mg by mouth.    Marland Kitchen aspirin 81 MG chewable tablet Chew 81 mg by mouth daily.     Marland Kitchen atorvastatin (LIPITOR) 80 MG tablet     . Coenzyme Q10 100 MG capsule Take 100 mg by mouth 2 (two) times daily.    . Multiple Vitamin (MULTIVITAMIN) capsule Take by mouth.    . Omega-3 Fatty Acids (FISH OIL) 1000 MG CAPS Take 1 capsule by mouth. daily    . Specialty Vitamins  Products (MAGNESIUM, AMINO ACID CHELATE,) 133 MG tablet Take by mouth daily. 1 tablet daily    . telmisartan (MICARDIS) 40 MG tablet Take 1 tablet (40 mg total) by mouth daily. 30 tablet 3  . Vitamin D, Ergocalciferol, (DRISDOL) 50000 units CAPS capsule Take by mouth.     No current facility-administered medications on file prior to visit.    LABS/IMAGING: No results found for this or any previous visit (from the past 48 hour(s)). No results found.  LIPID PANEL: No results found for: CHOL, TRIG, HDL, CHOLHDL, VLDL, LDLCALC, LDLDIRECT  WEIGHTS: Wt Readings from Last 3 Encounters:  01/08/21 217 lb 6.4 oz (98.6 kg)  09/04/20  216 lb (98 kg)  05/07/20 217 lb 12.8 oz (98.8 kg)    VITALS: BP 122/62   Pulse (!) 53   Ht 5\' 10"  (1.778 m)   Wt 217 lb 6.4 oz (98.6 kg)   SpO2 98%   BMI 31.19 kg/m   EXAM: General appearance: alert and no distress Neck: no carotid bruit, no JVD and thyroid not enlarged, symmetric, no tenderness/mass/nodules Lungs: clear to auscultation bilaterally Heart: regular rate and rhythm, S1, S2 normal, no murmur, click, rub or gallop Abdomen: soft, non-tender; bowel sounds normal; no masses,  no organomegaly Extremities: extremities normal, atraumatic, no cyanosis or edema Pulses: 2+ and symmetric Skin: Skin color, texture, turgor normal. No rashes or lesions Neurologic: Grossly normal Psych: Pleasant  EKG: Sinus bradycardia 53, nonspecific ST changes-personally reviewed   ASSESSMENT: 1. Dyspnea 2. Hypertension 3. Dyslipidemia 4. Family history of coronary artery disease  PLAN: 1.   Michael Bishop has had improvement in his diastolic blood pressures after lowering his telmisartan.  The blood pressure appears to be good today.  He says his symptoms have improved and his exercise tolerance is better.  No further changes at this time to his medicines and plan follow-up with me annually or sooner as necessary.  Pixie Casino, MD, [facility], [place] Director of the Advanced Lipid Disorders &  Cardiovascular Risk Reduction Clinic Diplomate of the American Board of Clinical Lipidology Attending Cardiologist  Direct Dial: [phone]  Fax: [phone]  Website:  [email or web address] 01/08/2021, 9:45 AM

## 2021-01-10 DIAGNOSIS — R69 Illness, unspecified: Secondary | ICD-10-CM | POA: Diagnosis not present

## 2021-01-10 DIAGNOSIS — R413 Other amnesia: Secondary | ICD-10-CM | POA: Diagnosis not present

## 2021-01-10 DIAGNOSIS — Z029 Encounter for administrative examinations, unspecified: Secondary | ICD-10-CM | POA: Diagnosis not present

## 2021-01-10 DIAGNOSIS — E785 Hyperlipidemia, unspecified: Secondary | ICD-10-CM | POA: Diagnosis not present

## 2021-01-10 DIAGNOSIS — I129 Hypertensive chronic kidney disease with stage 1 through stage 4 chronic kidney disease, or unspecified chronic kidney disease: Secondary | ICD-10-CM | POA: Diagnosis not present

## 2021-01-10 DIAGNOSIS — N1831 Chronic kidney disease, stage 3a: Secondary | ICD-10-CM | POA: Diagnosis not present

## 2021-01-23 ENCOUNTER — Other Ambulatory Visit: Payer: Self-pay | Admitting: Internal Medicine

## 2021-01-28 ENCOUNTER — Encounter: Payer: Self-pay | Admitting: *Deleted

## 2021-01-30 ENCOUNTER — Ambulatory Visit: Payer: Medicare HMO | Admitting: Neurology

## 2021-01-30 ENCOUNTER — Encounter: Payer: Self-pay | Admitting: Neurology

## 2021-01-30 VITALS — BP 154/67 | HR 62 | Ht 70.0 in | Wt 220.0 lb

## 2021-01-30 DIAGNOSIS — R413 Other amnesia: Secondary | ICD-10-CM | POA: Diagnosis not present

## 2021-01-30 DIAGNOSIS — F988 Other specified behavioral and emotional disorders with onset usually occurring in childhood and adolescence: Secondary | ICD-10-CM | POA: Diagnosis not present

## 2021-01-30 DIAGNOSIS — R69 Illness, unspecified: Secondary | ICD-10-CM | POA: Diagnosis not present

## 2021-01-30 DIAGNOSIS — Z636 Dependent relative needing care at home: Secondary | ICD-10-CM | POA: Insufficient documentation

## 2021-01-30 MED ORDER — ESCITALOPRAM OXALATE 10 MG PO TABS: 10.0000 mg | ORAL_TABLET | Freq: Every day | ORAL | 1 refills | Status: DC

## 2021-01-30 NOTE — Addendum Note (Signed)
Addended by: Gildardo Griffes on: 01/30/2021 05:14 PM   Modules accepted: Orders

## 2021-01-30 NOTE — Patient Instructions (Signed)
There are well-accepted and sensible ways to reduce risk for Alzheimers disease and other degenerative brain disorders .  Exercise Daily Walk A daily 20 minute walk should be part of your routine. Disease related apathy can be a significant roadblock to exercise and the only way to overcome this is to make it a daily routine and perhaps have a reward at the end (something your loved one loves to eat or drink perhaps) or a personal trainer coming to the home can also be very useful. Most importantly, the patient is much more likely to exercise if the caregiver / spouse does it with him/her. In general a structured, repetitive schedule is best.  General Health: Any diseases which effect your body will effect your brain such as a pneumonia, urinary infection, blood clot, heart attack or stroke. Keep contact with your primary care doctor for regular follow ups.  Sleep. A good nights sleep is healthy for the brain. Seven hours is recommended. If you have insomnia or poor sleep habits we can give you some instructions. If you have sleep apnea wear your mask.  Diet: Eating a heart healthy diet is also a good idea; fish and poultry instead of red meat, nuts (mostly non-peanuts), vegetables, fruits, olive oil or canola oil (instead of butter), minimal salt (use other spices to flavor foods), whole grain rice, bread, cereal and pasta and wine in moderation.Research is now showing that the MIND diet, which is a combination of The Mediterranean diet and the DASH diet, is beneficial for cognitive processing and longevity. Information about this diet can be found in The MIND Diet, a book by Maggie Moon, MS, RDN, and online at https://www.healthline.com/nutrition/mind-diet  Finances, Power of Attorney and Advance Directives: You should consider putting legal safeguards in place with regard to financial and medical decision making. While the spouse always has power of attorney for medical and financial issues in the  absence of any form, you should consider what you want in case the spouse / caregiver is no longer around or capable of making decisions.   The Alzheimers Association Position on Disease Prevention  Can Alzheimer's be prevented? It's a question that continues to intrigue researchers and fuel new investigations. There are no clear-cut answers yet -- partially due to the need for more large-scale studies in diverse populations -- but promising research is under way. The Alzheimer's Association is leading the worldwide effort to find a treatment for Alzheimer's, delay its onset and prevent it from developing.   What causes Alzheimer's? Experts agree that in the vast majority of cases, Alzheimer's, like other common chronic conditions, probably develops as a result of complex interactions among multiple factors, including age, genetics, environment, lifestyle and coexisting medical conditions. Although some risk factors -- such as age or genes -- cannot be changed, other risk factors -- such as high blood pressure and lack of exercise -- usually can be changed to help reduce risk. Research in these areas may lead to new ways to detect those at highest risk.  Prevention studies A small percentage of people with Alzheimer's disease (less than 1 percent) have an early-onset type associated with genetic mutations. Individuals who have these genetic mutations are guaranteed to develop the disease. An ongoing clinical trial conducted by the [facility] ([facility]), is testing whether antibodies to beta-amyloid can reduce the accumulation of beta-amyloid plaque in the brains of people with such genetic mutations and thereby reduce, delay or prevent symptoms. Participants in the trial are receiving antibodies (  or placebo) before they develop symptoms, and the development of beta-amyloid plaques is being monitored by brain scans and other tests.  Another clinical trial, known as the A4 trial  (Anti-Amyloid Treatment in Asymptomatic Alzheimer's), is testing whether antibodies to beta-amyloid can reduce the risk of Alzheimer's disease in older people (ages 65 to 85) at high risk for the disease. The A4 trial is being conducted by the [facility]'s [facility].  Though research is still evolving, evidence is strong that people can reduce their risk by making key lifestyle changes, including participating in regular activity and maintaining good heart health. Based on this research, the Alzheimer's Association offers 10 Ways to Love Your Brain -- a collection of tips that can reduce the risk of cognitive decline.  Heart-head connection  New research shows there are things we can do to reduce the risk of mild cognitive impairment and dementia.  Several conditions known to increase the risk of cardiovascular disease -- such as high blood pressure, diabetes and high cholesterol -- also increase the risk of developing Alzheimer's. Some autopsy studies show that as many as 80 percent of individuals with Alzheimer's disease also have cardiovascular disease.  A longstanding question is why some people develop hallmark Alzheimer's plaques and tangles but do not develop the symptoms of Alzheimer's. Vascular disease may help researchers eventually find an answer. Some autopsy studies suggest that plaques and tangles may be present in the brain without causing symptoms of cognitive decline unless the brain also shows evidence of vascular disease. More research is needed to better understand the link between vascular health and Alzheimer's.  Physical exercise and diet Regular physical exercise may be a beneficial strategy to lower the risk of Alzheimer's and vascular dementia. Exercise may directly benefit brain cells by increasing blood and oxygen flow in the brain. Because of its known cardiovascular benefits, a medically approved exercise program is a valuable part of any overall wellness  plan.  Current evidence suggests that heart-healthy eating may also help protect the brain. Heart-healthy eating includes limiting the intake of sugar and saturated fats and making sure to eat plenty of fruits, vegetables, and whole grains. No one diet is best. Two diets that have been studied and may be beneficial are the DASH (Dietary Approaches to Stop Hypertension) diet and the Mediterranean diet. The DASH diet emphasizes vegetables, fruits and fat-free or low-fat dairy products; includes whole grains, fish, poultry, beans, seeds, nuts and vegetable oils; and limits sodium, sweets, sugary beverages and red meats. A Mediterranean diet includes relatively little red meat and emphasizes whole grains, fruits and vegetables, fish and shellfish, and nuts, olive oil and other healthy fats.  Social connections and intellectual activity A number of studies indicate that maintaining strong social connections and keeping mentally active as we age might lower the risk of cognitive decline and Alzheimer's. Experts are not certain about the reason for this association. It may be due to direct mechanisms through which social and mental stimulation strengthen connections between nerve cells in the brain.  Head trauma There appears to be a strong link between future risk of Alzheimer's and serious head trauma, especially when injury involves loss of consciousness. You can help reduce your risk of Alzheimer's by protecting your head.  Wear a seat belt  Use a helmet when participating in sports  "Fall-proof" your home   What you can do now While research is not yet conclusive, certain lifestyle choices, such as physical activity and diet, may help support brain   health and prevent Alzheimer's. Many of these lifestyle changes have been shown to lower the risk of other diseases, like heart disease and diabetes, which have been linked to Alzheimer's. With few drawbacks and plenty of known benefits, healthy lifestyle  choices can improve your health and possibly protect your brain.  Learn more about brain health. You can help increase our knowledge by considering participation in a clinical study. Our free clinical trial matching services, Trial[employer], can help you find clinical trials in your area that are seeking volunteers.  Understanding prevention research Here are some things to keep in mind about the research underlying much of our current knowledge about possible prevention:  Insights about potentially modifiable risk factors apply to large population groups, not to individuals. Studies can show that factor X is associated with outcome Y, but cannot guarantee that any specific person will have that outcome. As a result, you can "do everything right" and still have a serious health problem or "do everything wrong" and live to be 100.  Much of our current evidence comes from large epidemiological studies such as the [place] Aging Study, the Nurses' Health Study, the Adult Changes in Thought Study and the [employer] These studies explore pre-existing behaviors and use statistical methods to relate those behaviors to health outcomes. This type of study can show an "association" between a factor and an outcome but cannot "prove" cause and effect. This is why we describe evidence based on these studies with such language as "suggests," "may show," "might protect," and "is associated with."  The gold standard for showing cause and effect is a clinical trial in which participants are randomly assigned to a prevention or risk management strategy or a control group. Researchers follow the two groups over time to see if their outcomes differ significantly.  It is unlikely that some prevention or risk management strategies will ever be tested in randomized trials for ethical or practical reasons. One example is exercise. Definitively testing the impact of exercise on Alzheimer's risk would require a huge  trial enrolling thousands of people and following them for many years. The expense and logistics of such a trial would be prohibitive, and it would require some people to go without exercise, a known health benefit.     Mindfulness-Based Stress Reduction Mindfulness-based stress reduction (MBSR) is a program that helps people learn to practice mindfulness. Mindfulness is the practice of intentionally paying attention to the present moment. MBSR focuses on developing self-awareness, which allows you to respond to life stress without judgment or negative emotions. It can be learned and practiced through techniques such as education, breathing exercises, meditation, and yoga. MBSR includes several mindfulness techniques in one program. MBSR works best when you understand the treatment, are willing to try new things, and can commit to spending time practicing what you learn. MBSR training may include learning about: How your emotions, thoughts, and reactions affect your body. New ways to respond to things that cause negative thoughts to start (triggers). How to notice your thoughts and let go of them. Practicing awareness of everyday things that you normally do without thinking. The techniques and goals of different types of meditation. What are the benefits of MBSR? MBSR can have many benefits, which include helping you to: Develop self-awareness. This refers to knowing and understanding yourself. Learn skills and attitudes that help you to participate in your own health care. Learn new ways to care for yourself. Be more accepting about how things are, and let things go.  Be less judgmental and approach things with an open mind. Be patient with yourself and trust yourself more. MBSR has also been shown to: Reduce negative emotions, such as depression and anxiety. Improve memory and focus. Change how you sense and approach pain. Boost your body's ability to fight infections. Help you connect  better with other people. Improve your sense of well-being. Follow these instructions at home: Find a local in-person or online MBSR program. Set aside some time regularly for mindfulness practice. Find a mindfulness practice that works best for you. This may include one or more of the following: Meditation. Meditation involves focusing your mind on a certain thought or activity. Breathing awareness exercises. These help you to stay present by focusing on your breath. Body scan. For this practice, you lie down and pay attention to each part of your body from head to toe. You can identify tension and soreness and intentionally relax parts of your body. Yoga. Yoga involves stretching and breathing, and it can improve your ability to move and be flexible. It can also provide an experience of testing your body's limits, which can help you release stress. Mindful eating. This way of eating involves focusing on the taste, texture, color, and smell of each bite of food. Because this slows down eating and helps you feel full sooner, it can be an important part of a weight-loss plan. Find a podcast or recording that provides guidance for breathing awareness, body scan, or meditation exercises. You can listen to these any time when you have a free moment to rest without distractions. Follow your treatment plan as told by your health care provider. This may include taking regular medicines and making changes to your diet or lifestyle as recommended.   How to practice mindfulness To do a basic awareness exercise: Find a comfortable place to sit. Pay attention to the present moment. Observe your thoughts, feelings, and surroundings just as they are. Avoid placing judgment on yourself, your feelings, or your surroundings. Make note of any judgment that comes up, and let it go. Your mind may wander, and that is okay. Make note of when your thoughts drift, and return your attention to the present moment. To do  basic mindfulness meditation: Find a comfortable place to sit. This may include a stable chair or a firm floor cushion. Sit upright with your back straight. Let your arms fall next to your side with your hands resting on your legs. If sitting in a chair, rest your feet flat on the floor. If sitting on a cushion, cross your legs in front of you. Keep your head in a neutral position with your chin dropped slightly. Relax your jaw and rest the tip of your tongue on the roof of your mouth. Drop your gaze to the floor. You can close your eyes if you like. Breathe normally and pay attention to your breath. Feel the air moving in and out of your nose. Feel your belly expanding and relaxing with each breath. Your mind may wander, and that is okay. Make note of when your thoughts drift, and return your attention to your breath. Avoid placing judgment on yourself, your feelings, or your surroundings. Make note of any judgment or feelings that come up, let them go, and bring your attention back to your breath. When you are ready, lift your gaze or open your eyes. Pay attention to how your body feels after the meditation. Where to find more information You can find more information about MBSR from:  Your health care provider. Community-based meditation centers or programs. Programs offered near you. Summary Mindfulness-based stress reduction (MBSR) is a program that teaches you how to intentionally pay attention to the present moment. It is used with other treatments to help you cope better with daily stress, emotions, and pain. MBSR focuses on developing self-awareness, which allows you to respond to life stress without judgment or negative emotions. MBSR programs may involve learning different mindfulness practices, such as breathing exercises, meditation, yoga, body scan, or mindful eating. Find a mindfulness practice that works best for you, and set aside time for it on a regular basis. This information is  not intended to replace advice given to you by your health care provider. Make sure you discuss any questions you have with your health care provider. Document Revised: 04/26/2020 Document Reviewed: 04/26/2020 [employer] Patient Education  2021 [employer].

## 2021-01-30 NOTE — Progress Notes (Signed)
Provider:  Larey Seat, MD  Primary Care Physician:  Prince Solian, [place] [address]     Referring Provider: Prince Solian, [place],  [place] [id number]          Chief Complaint according to patient   Patient presents with:     New Patient (Initial Visit)     Concern about memory       HISTORY OF PRESENT ILLNESS:  Maxten Shuler is a 75 y.o. year old White or Caucasian male patient seen here as a referral on 01/30/2021 from dr Dagmar Hait, for memory evaluatio, here with his daughter, husband of KELLAR WESTBERG, a long term patient at [facility] with MS>    I have the pleasure of seeing Randol Zumstein today, a right-handed White or Caucasian male who  has a past medical history of BPH (benign prostatic hyperplasia), Depression, GERD (gastroesophageal reflux disease), Hyperlipidemia, Hypertension, Mood disorder ([place]), and Seasonal allergic rhinitis..         Family medical history: no history of dementia, one brother (109) with CAD, no memory loss. Father was 6 at the time of death and mother 17. MGF 92, PGP died younger 59-60. 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    Social history:  Patient is retired from [employer], retired since 2007, and lives in a household with betty and a son, son with mental illness- Pets are not present. Tobacco use; quit 01-1973 . Tea ( none ) or energy drinks.  ETOH use seldomly, Caffeine intake in form of Coffee( 1 cup a day) Soda( 4 / week ) Regular exercise in form of walking, yard work.   Hobbies : tinkers on cars.       Sleep habits are as follows: The patient's dinner time is between 5-6 PM. The patient goes to bed at 11 PM and continues to sleep for 4-5 hours, has become a short sleeper, sometimes getting another 1-2 hours.  The preferred sleep position is sideways, with the support of 1-2 pillows. Dreams are reportedly rare.  8 AM is the usual rise time. The patient wakes up  spontaneously/ He reports feeling refreshed or restored in AM.   Review of Systems: Out of a complete 14 system review, the patient complains of only the following symptoms, and all other reviewed systems are negative.:   Not impulsive no pain, shorter sleeper, distractably   Social History   Socioeconomic History   Marital status: Married    Spouse name: Not on file   Number of children: Not on file   Years of education: 16   Highest education level: Bachelor's degree (e.g., BA, AB, BS)  Occupational History   Not on file  Tobacco Use   Smoking status: Former    Pack years: 0.00   Smokeless tobacco: Never  Vaping Use   Vaping Use: Never used  Substance and Sexual Activity   Alcohol use: Yes    Comment: occ   Drug use: No   Sexual activity: Not on file  Other Topics Concern   Not on file  Social History Narrative   Lives at home with wife and son is there most of the time    Right handed   Caffeine: avg 3 cups/day of half caffeine coffee   Social Determinants of Health   Financial Resource Strain: Not on file  Food Insecurity: Not on file  Transportation Needs: Not on file  Physical Activity: Not on file  Stress: Not on file  Social Connections: Not on file    Family History  Problem Relation Age of Onset   Hypertension Mother    Hypertension Father    Heart disease Father    Heart disease Brother    Alzheimer's disease Paternal Aunt     Past Medical History:  Diagnosis Date   BPH (benign prostatic hyperplasia)    Depression    GERD (gastroesophageal reflux disease)    Hyperlipidemia    Hypertension    Mood disorder ([place])    Seasonal allergic rhinitis     Past Surgical History:  Procedure Laterality Date   SHOULDER INJECTION Right    Dr Ninfa Linden; had significant relief   TONSILLECTOMY  1954     Current Outpatient Medications on File Prior to Visit  Medication Sig Dispense Refill   Ascorbic Acid (VITAMIN C) 1000 MG tablet Take 1,000 mg by  mouth.     aspirin 81 MG chewable tablet Chew 81 mg by mouth daily.      Astaxanthin 5 MG CAPS Take 5 mg by mouth in the morning and at bedtime.     atorvastatin (LIPITOR) 80 MG tablet      Cholecalciferol (VITAMIN D3) 50 MCG (2000 UT) TABS Take by mouth in the morning and at bedtime.     Coenzyme Q10 100 MG capsule Take 100 mg by mouth 2 (two) times daily.     Melatonin 10 MG TABS Take 10 mg by mouth at bedtime.     Multiple Vitamin (MULTIVITAMIN) capsule Take by mouth.     Omega-3 Fatty Acids (FISH OIL) 1000 MG CAPS Take 1 capsule by mouth. daily     Resveratrol 100 MG CAPS Take 100 mg by mouth in the morning and at bedtime.     Specialty Vitamins Products (MAGNESIUM, AMINO ACID CHELATE,) 133 MG tablet Take by mouth daily. 1 tablet daily     telmisartan (MICARDIS) 40 MG tablet Take 1 tablet by mouth once daily 90 tablet 3   No current facility-administered medications on file prior to visit.    No Known Allergies  Physical exam:  Today's Vitals   01/30/21 1546  BP: (!) 154/67  Pulse: 62  Weight: 220 lb (99.8 kg)  Height: 5\' 10"  (1.778 m)   Body mass index is 31.57 kg/m.   Wt Readings from Last 3 Encounters:  01/30/21 220 lb (99.8 kg)  01/08/21 217 lb 6.4 oz (98.6 kg)  09/04/20 216 lb (98 kg)     Ht Readings from Last 3 Encounters:  01/30/21 5\' 10"  (1.778 m)  01/08/21 5\' 10"  (1.778 m)  09/04/20 5\' 10"  (1.778 m)      General: The patient is awake, alert and appears not in acute distress. The patient is well groomed. Head: Normocephalic, atraumatic.  Neck is supple. Mallampati 2,  Cardiovascular:  Regular rate and cardiac rhythm by pulse,  without distended neck veins. Respiratory: Lungs are clear to auscultation.  Skin:  With evidence of ankle edema,no rash, left ankle more than right foot.  Trunk: The patient's posture is erect.   Neurologic exam : The patient is awake and alert, oriented to place and time.   Memory subjective described as intact.   mini-mental  status exam_ 27/ 30 No flowsheet data found.  MOCA 28/30   Attention span & concentration ability appears normal.  Speech is fluent,  without  dysarthria, dysphonia or aphasia.  Mood and affect are appropriate.  Cranial nerves: no loss of smell or taste reported  Pupils are equal and briskly reactive to light. Funduscopic exam deferred. .  Extraocular movements in vertical and horizontal planes were intact and without nystagmus. No Diplopia. Visual fields by finger perimetry are intact. Hearing was intact to soft voice and finger rubbing.    Facial sensation intact to fine touch.  Facial motor strength is symmetric and tongue  moves midline. Left uvula lower-  Neck ROM : rotation, tilt and flexion extension were normal for age and shoulder shrug was symmetrical.    Motor exam:  Symmetric bulk, tone and ROM.   Normal tone without cog wheeling, symmetric grip strength .   Sensory:  Fine touch and vibration were normal.  Proprioception tested in the upper extremities was normal.   Coordination: Rapid alternating movements in the fingers/hands were of normal speed.  The Finger-to-nose maneuver was intact without evidence of ataxia, dysmetria or tremor.   Gait and station: Patient could rise unassisted from a seated position, walked without assistive device.  Stance is of normal width/ base and the patient turned with 3 steps.  Toe and heel walk were deferred.  Deep tendon reflexes: in the  upper and lower extremities are symmetric and intact.  Babinski response was deferred .       After spending a total time of 60 minutes face to face and additional time for physical and neurologic examination, review of laboratory studies,  personal review of imaging studies, reports and results of other testing and review of referral information / records as far as provided in visit, I have established the following assessments:  1)  Delayed name recall, short term memory loss but normal scores  in MMSE and MOCA.  2)  no history of CVA, seizures or TBI 3)  attention may be a lifelong problem. Not detail oriented.  4) caretaker burn out. ?   My Plan is to proceed with:  1) no medication needed, no imaging.   2)  consider attention specialist testing.  3)  Zoloft 20 mg daily to continue.   I would like to thank Prince Solian, MD and Prince Solian, [address] [place],  [place] [address] for allowing me to meet with and to take care of this pleasant patient.   In short, Huy Majid is presenting with subjective memory loss  a symptom that can be attributed to adult ADHD and aging.   I plan to follow up either personally or through our NP within 12 month. MOCA repeat.   CC: I will share my notes with PCP .  Electronically signed by: Larey Seat, MD 01/30/2021 4:27 PM  [facility] Board certified by The [employer] of Sleep Medicine and Diplomate of the [employer] of Sleep Medicine. Board certified In Neurology through the [place], Fellow of the [employer] of Neurology. Medical Director of [facility].

## 2021-03-19 DIAGNOSIS — Z7722 Contact with and (suspected) exposure to environmental tobacco smoke (acute) (chronic): Secondary | ICD-10-CM | POA: Diagnosis not present

## 2021-03-19 DIAGNOSIS — I10 Essential (primary) hypertension: Secondary | ICD-10-CM | POA: Diagnosis not present

## 2021-03-19 DIAGNOSIS — Z8249 Family history of ischemic heart disease and other diseases of the circulatory system: Secondary | ICD-10-CM | POA: Diagnosis not present

## 2021-03-19 DIAGNOSIS — E669 Obesity, unspecified: Secondary | ICD-10-CM | POA: Diagnosis not present

## 2021-03-19 DIAGNOSIS — E785 Hyperlipidemia, unspecified: Secondary | ICD-10-CM | POA: Diagnosis not present

## 2021-03-19 DIAGNOSIS — Z87891 Personal history of nicotine dependence: Secondary | ICD-10-CM | POA: Diagnosis not present

## 2021-03-19 DIAGNOSIS — Z6831 Body mass index (BMI) 31.0-31.9, adult: Secondary | ICD-10-CM | POA: Diagnosis not present

## 2021-03-19 DIAGNOSIS — Z803 Family history of malignant neoplasm of breast: Secondary | ICD-10-CM | POA: Diagnosis not present

## 2021-03-19 DIAGNOSIS — R69 Illness, unspecified: Secondary | ICD-10-CM | POA: Diagnosis not present

## 2021-03-20 DIAGNOSIS — H524 Presbyopia: Secondary | ICD-10-CM | POA: Diagnosis not present

## 2021-03-20 DIAGNOSIS — H52223 Regular astigmatism, bilateral: Secondary | ICD-10-CM | POA: Diagnosis not present

## 2021-07-22 DIAGNOSIS — Z125 Encounter for screening for malignant neoplasm of prostate: Secondary | ICD-10-CM | POA: Diagnosis not present

## 2021-07-22 DIAGNOSIS — E785 Hyperlipidemia, unspecified: Secondary | ICD-10-CM | POA: Diagnosis not present

## 2021-07-22 DIAGNOSIS — I10 Essential (primary) hypertension: Secondary | ICD-10-CM | POA: Diagnosis not present

## 2021-07-25 DIAGNOSIS — R69 Illness, unspecified: Secondary | ICD-10-CM | POA: Diagnosis not present

## 2021-07-25 DIAGNOSIS — R413 Other amnesia: Secondary | ICD-10-CM | POA: Diagnosis not present

## 2021-07-25 DIAGNOSIS — I129 Hypertensive chronic kidney disease with stage 1 through stage 4 chronic kidney disease, or unspecified chronic kidney disease: Secondary | ICD-10-CM | POA: Diagnosis not present

## 2021-07-25 DIAGNOSIS — K635 Polyp of colon: Secondary | ICD-10-CM | POA: Diagnosis not present

## 2021-07-25 DIAGNOSIS — Z0000 Encounter for general adult medical examination without abnormal findings: Secondary | ICD-10-CM | POA: Diagnosis not present

## 2021-07-25 DIAGNOSIS — N1831 Chronic kidney disease, stage 3a: Secondary | ICD-10-CM | POA: Diagnosis not present

## 2021-07-25 DIAGNOSIS — E669 Obesity, unspecified: Secondary | ICD-10-CM | POA: Diagnosis not present

## 2021-07-25 DIAGNOSIS — R82998 Other abnormal findings in urine: Secondary | ICD-10-CM | POA: Diagnosis not present

## 2021-07-25 DIAGNOSIS — E785 Hyperlipidemia, unspecified: Secondary | ICD-10-CM | POA: Diagnosis not present

## 2021-07-25 DIAGNOSIS — G4700 Insomnia, unspecified: Secondary | ICD-10-CM | POA: Diagnosis not present

## 2021-07-25 DIAGNOSIS — Z23 Encounter for immunization: Secondary | ICD-10-CM | POA: Diagnosis not present

## 2021-12-15 DIAGNOSIS — K121 Other forms of stomatitis: Secondary | ICD-10-CM | POA: Diagnosis not present

## 2022-03-06 ENCOUNTER — Ambulatory Visit: Payer: Medicare HMO | Admitting: Adult Health

## 2022-03-09 NOTE — Progress Notes (Deleted)
Cardiology Clinic Note   Patient Name: Michael Bishop Date of Encounter: 03/09/2022  Primary Care Provider:  Prince Solian, MD Primary Cardiologist:  Pixie Casino, MD  Patient Profile    Michael Bishop 76 year old male presents the clinic today for follow-up evaluation of his essential hypertension and hyperlipidemia.  Past Medical History    Past Medical History:  Diagnosis Date   BPH (benign prostatic hyperplasia)    Depression    GERD (gastroesophageal reflux disease)    Hyperlipidemia    Hypertension    Mood disorder ([place])    Seasonal allergic rhinitis    Past Surgical History:  Procedure Laterality Date   SHOULDER INJECTION Right    Dr Ninfa Linden; had significant relief   TONSILLECTOMY  1954    Allergies  No Known Allergies  History of Present Illness    Michael Bishop has a PMH of essential hypertension, dyslipidemia, memory loss, attention deficit disorder, and family history of peripheral vascular disease.  His father underwent CABG surgery at age 46 and passed away at age 50.  He has a brother who also had coronary artery bypass graft at age 66.  He was initially referred by his PCP for evaluation of his elevated blood pressure.  He was asymptomatic at that time.  His blood pressure was 142/80.  His medications were atorvastatin, telmisartan, and aspirin.  He had been transitioned from losartan to telmisartan.  He was seen in follow-up by Dr. Debara Pickett on 01/08/2021.  During that time he presented via virtual visit.  He noted some fatigue and shortness of breath.  He felt like his blood pressure was low.  It was recommended that his telmisartan be decreased from 80 to 40 mg daily.  He reported that his decrease in medication was effective for him.  His blood pressure was 122/66.  His heart rate was in the 50s.  He denied shortness of breath and chest discomfort.  He was able to walk long distances without issue.  He presents to the clinic today for evaluation  states***  *** denies chest pain, shortness of breath, lower extremity edema, fatigue, palpitations, melena, hematuria, hemoptysis, diaphoresis, weakness, presyncope, syncope, orthopnea, and PND.  Essential hypertension-blood pressure today***.  Well-controlled at home. Continue telmisartan Heart healthy low-sodium diet-salty 6 given Increase physical activity as tolerated  Shortness of breath-no increased DOE or activity intolerance.  Reports being fairly sedentary.  Appears to be related to deconditioning. Increase physical activity as tolerated Heart healthy low-sodium diet  Dyslipidemia-LDL*** Continue aspirin, atorvastatin, omega-3 fatty acids Heart healthy low-sodium high-fiber diet.   Increase physical activity as tolerated  Disposition: Follow-up with Dr. Debara Pickett in 12 months.  Echocardiogram 05/16/2020 IMPRESSIONS     1. Left ventricular ejection fraction by 3D volume is 59 %. The left  ventricle has normal function. The left ventricle has no regional wall  motion abnormalities. Left ventricular diastolic parameters were normal.  The average left ventricular global  longitudinal strain is -26.2 %. The global longitudinal strain is normal.   2. Right ventricular systolic function is normal. The right ventricular  size is normal.   3. The mitral valve is normal in structure. Trivial mitral valve  regurgitation. No evidence of mitral stenosis.   4. The aortic valve is normal in structure. Aortic valve regurgitation is  not visualized. No aortic stenosis is present.   5. The inferior vena cava is normal in size with greater than 50%  respiratory variability, suggesting right atrial pressure of 3 mmHg.  Home Medications    Prior to Admission medications   Medication Sig Start Date End Date Taking? Authorizing Provider  Ascorbic Acid (VITAMIN C) 1000 MG tablet Take 1,000 mg by mouth.    [provider]  aspirin 81 MG chewable tablet Chew 81 mg by mouth daily.      [provider]  Astaxanthin 5 MG CAPS Take 5 mg by mouth in the morning and at bedtime.    [provider]  atorvastatin (LIPITOR) 80 MG tablet  11/25/16   [provider]  Cholecalciferol (VITAMIN D3) 50 MCG (2000 UT) TABS Take by mouth in the morning and at bedtime.    [provider]  Coenzyme Q10 100 MG capsule Take 100 mg by mouth 2 (two) times daily.    [provider]  escitalopram (LEXAPRO) 10 MG tablet Take 1 tablet (10 mg total) by mouth at bedtime. 01/30/21   Dohmeier, Asencion Partridge, MD  Melatonin 10 MG TABS Take 10 mg by mouth at bedtime.    [provider]  Multiple Vitamin (MULTIVITAMIN) capsule Take by mouth.    [provider]  Omega-3 Fatty Acids (FISH OIL) 1000 MG CAPS Take 1 capsule by mouth. daily    [provider]  Resveratrol 100 MG CAPS Take 100 mg by mouth in the morning and at bedtime.    [provider]  Specialty Vitamins Products (MAGNESIUM, AMINO ACID CHELATE,) 133 MG tablet Take by mouth daily. 1 tablet daily    [provider]  telmisartan (MICARDIS) 40 MG tablet Take 1 tablet by mouth once daily 01/24/21   Hilty, Nadean Corwin, MD    Family History    Family History  Problem Relation Age of Onset   Hypertension Mother    Hypertension Father    Heart disease Father    Heart disease Brother    Alzheimer's disease Paternal Aunt    He indicated that his mother is deceased. He indicated that his father is deceased. He indicated that his brother is alive. He indicated that the status of his paternal aunt is unknown.  Social History    Social History   Socioeconomic History   Marital status: Married    Spouse name: Not on file   Number of children: Not on file   Years of education: 16   Highest education level: Bachelor's degree (e.g., BA, AB, BS)  Occupational History   Not on file  Tobacco Use   Smoking status: Former   Smokeless tobacco: Never  Vaping Use   Vaping Use:  Never used  Substance and Sexual Activity   Alcohol use: Yes    Comment: occ   Drug use: No   Sexual activity: Not on file  Other Topics Concern   Not on file  Social History Narrative   Lives at home with wife and son is there most of the time    Right handed   Caffeine: avg 3 cups/day of half caffeine coffee   Social Determinants of Health   Financial Resource Strain: Not on file  Food Insecurity: Not on file  Transportation Needs: Not on file  Physical Activity: Not on file  Stress: Not on file  Social Connections: Not on file  Intimate Partner Violence: Not on file     Review of Systems    General:  No chills, fever, night sweats or weight changes.  Cardiovascular:  No chest pain, dyspnea on exertion, edema, orthopnea, palpitations, paroxysmal nocturnal dyspnea. Dermatological: No rash, lesions/masses Respiratory:  No cough, dyspnea Urologic: No hematuria, dysuria Abdominal:   No nausea, vomiting, diarrhea, bright red blood per rectum, melena, or hematemesis Neurologic:  No visual changes, wkns, changes in mental status. All other systems reviewed and are otherwise negative except as noted above.  Physical Exam    VS:  There were no vitals taken for this visit. , BMI There is no height or weight on file to calculate BMI. GEN: Well nourished, well developed, in no acute distress. HEENT: normal. Neck: Supple, no JVD, carotid bruits, or masses. Cardiac: RRR, no murmurs, rubs, or gallops. No clubbing, cyanosis, edema.  Radials/DP/PT 2+ and equal bilaterally.  Respiratory:  Respirations regular and unlabored, clear to auscultation bilaterally. GI: Soft, nontender, nondistended, BS + x 4. MS: no deformity or atrophy. Skin: warm and dry, no rash. Neuro:  Strength and sensation are intact. Psych: Normal affect.  Accessory Clinical Findings    Recent Labs: No results found for requested labs within last 365 days.   Recent Lipid Panel No results found for: "CHOL",  "TRIG", "HDL", "CHOLHDL", "VLDL", "LDLCALC", "LDLDIRECT"  ECG personally reviewed by me today- *** - No acute changes  Assessment & Plan   1.  ***   Jossie Ng. Odyn Turko NP-C     03/09/2022, 7:44 AM [address] Office [phone] Fax [phone]  Notice: This dictation was prepared with Dragon dictation along with smaller phrase technology. Any transcriptional errors that result from this process are unintentional and may not be corrected upon review.  I spent***minutes examining this patient, reviewing medications, and using patient centered shared decision making involving her cardiac care.  Prior to her visit I spent greater than 20 minutes reviewing her past medical history,  medications, and prior cardiac tests.

## 2022-03-10 ENCOUNTER — Ambulatory Visit: Payer: Medicare HMO | Admitting: General Practice

## 2022-03-10 NOTE — Progress Notes (Unsigned)
Cardiology Office Note:    Date:  03/11/2022   ID:  Michael Bishop, DOB 1946/04/02, MRN [id number]  PCP:  Michael Bishop, [place] Cardiologist: Pixie Casino, MD   Reason for visit: 1 year follow-up  History of Present Illness:    Michael Bishop is a 76 y.o. male with a hx of hypertension, dyslipidemia, family history of CAD.  He last saw Dr. Debara Pickett in May 2022.  His borderline low BP & fatigue improved with decreasing telmisartan from 80 mg to '40mg'$ .  He previously complained of dyspnea but reported it was better.   Today, patient feels well.  He is on a stationary bike 3-4 times per week 15 to 20 minutes at a time, walks and mows his [place] acres without chest pain or shortness of breath.  Denies palpitations, PND, orthopnea, lower extremity edema and significant lightheadedness.  No bleeding issues with baby aspirin.  No issues with his medications.    Past Medical History:  Diagnosis Date   BPH (benign prostatic hyperplasia)    Depression    GERD (gastroesophageal reflux disease)    Hyperlipidemia    Hypertension    Mood disorder (HCC)    Seasonal allergic rhinitis     Past Surgical History:  Procedure Laterality Date   SHOULDER INJECTION Right    Dr Ninfa Linden; had significant relief   TONSILLECTOMY  1954    Current Medications: Current Meds  Medication Sig   Ascorbic Acid (VITAMIN C) 1000 MG tablet Take 1,000 mg by mouth.   aspirin 81 MG chewable tablet Chew 81 mg by mouth daily.    Astaxanthin 5 MG CAPS Take 5 mg by mouth in the morning and at bedtime.   atorvastatin (LIPITOR) 80 MG tablet    Cholecalciferol (VITAMIN D3) 50 MCG (2000 UT) TABS Take by mouth in the morning and at bedtime.   Coenzyme Q10 100 MG capsule Take 100 mg by mouth 2 (two) times daily.   escitalopram (LEXAPRO) 10 MG tablet Take 1 tablet (10 mg total) by mouth at bedtime.   Melatonin 10 MG TABS Take 10 mg by mouth at bedtime.   Multiple Vitamin (MULTIVITAMIN) capsule Take by  mouth.   Omega-3 Fatty Acids (FISH OIL) 1000 MG CAPS Take 1 capsule by mouth. daily   Resveratrol 100 MG CAPS Take 100 mg by mouth in the morning and at bedtime.   Specialty Vitamins Products (MAGNESIUM, AMINO ACID CHELATE,) 133 MG tablet Take by mouth daily. 1 tablet daily   telmisartan (MICARDIS) 40 MG tablet Take 1 tablet by mouth once daily     Allergies:   Patient has no known allergies.   Social History   Socioeconomic History   Marital status: Married    Spouse name: Not on file   Number of children: Not on file   Years of education: 16   Highest education level: Bachelor's degree (e.g., BA, AB, BS)  Occupational History   Not on file  Tobacco Use   Smoking status: Former   Smokeless tobacco: Never  Vaping Use   Vaping Use: Never used  Substance and Sexual Activity   Alcohol use: Yes    Comment: occ   Drug use: No   Sexual activity: Not on file  Other Topics Concern   Not on file  Social History Narrative   Lives at home with wife and son is there most of the time    Right handed   Caffeine: avg 3 cups/day of half  caffeine coffee   Social Determinants of Radio broadcast assistant Strain: Not on file  Food Insecurity: Not on file  Transportation Needs: Not on file  Physical Activity: Not on file  Stress: Not on file  Social Connections: Not on file     Family History: The patient's family history includes Alzheimer's disease in his paternal aunt; Heart disease in his brother and father; Hypertension in his father and mother.  ROS:   Please see the history of present illness.     EKGs/Labs/Other Studies Reviewed:    EKG:  The ekg ordered today demonstrates normal sinus rhythm, heart rate 60.  Recent Labs: No results found for requested labs within last 365 days.   Recent Lipid Panel No results found for: "CHOL", "TRIG", "HDL", "LDLCALC", "LDLDIRECT"  Physical Exam:    VS:  BP (!) 120/58   Pulse 60   Ht '5\' 10"'$  (1.778 m)   Wt 214 lb (97.1 kg)    SpO2 98%   BMI 30.71 kg/m    No data found.  Wt Readings from Last 3 Encounters:  03/11/22 214 lb (97.1 kg)  01/30/21 220 lb (99.8 kg)  01/08/21 217 lb 6.4 oz (98.6 kg)     GEN:  Well nourished, well developed in no acute distress HEENT: Normal NECK: No JVD; No carotid bruits CARDIAC: RRR, no murmurs, rubs, gallops RESPIRATORY:  Clear to auscultation without rales, wheezing or rhonchi  ABDOMEN: Soft, non-tender, non-distended MUSCULOSKELETAL: No edema; No deformity  SKIN: Warm and dry NEUROLOGIC:  Alert and oriented PSYCHIATRIC:  Normal affect     ASSESSMENT AND PLAN   Family history of heart disease -Patient without angina. -Lipids and blood pressure well controlled.  Given information about healthy lifestyle. -Continue aspirin 81 mg daily.  Hypertension, well controlled -Continue Micardis 40 mg daily.  Metabolic panel followed by PCP Dr. Dagmar Hait.   -Goal BP is <130/80.  Recommend DASH diet (high in vegetables, fruits, low-fat dairy products, whole grains, poultry, fish, and nuts and low in sweets, sugar-sweetened beverages, and red meats), salt restriction and increase physical activity.  Hyperlipidemia with goal LDL less than 100 -LDL 92 in November 2022.  Continue Lipitor 80 mg daily. -Discussed cholesterol lowering diets - Mediterranean diet, DASH diet, vegetarian diet, low-carbohydrate diet and avoidance of trans fats.  Discussed healthier choice substitutes.  Nuts, high-fiber foods, and fiber supplements may also improve lipids.    Disposition - Follow-up in with Dr. Debara Pickett or myself.   Medication Adjustments/Labs and Tests Ordered: Current medicines are reviewed at length with the patient today.  Concerns regarding medicines are outlined above.  No orders of the defined types were placed in this encounter.  No orders of the defined types were placed in this encounter.   Patient Instructions  Medication Instructions:  No Changes *If you need a refill on your  cardiac medications before your next appointment, please call your pharmacy*   Lab Work: No Labs If you have labs (blood work) drawn today and your tests are completely normal, you will receive your results only by: [place] (if you have MyChart) OR A paper copy in the mail If you have any lab test that is abnormal or we need to change your treatment, we will call you to review the results.   Testing/Procedures: No Testing   Follow-Up: At [facility], you and your health needs are our priority.  As part of our continuing mission to provide you with exceptional heart care, we have created  designated Provider Care Teams.  These Care Teams include your primary Cardiologist (physician) and Advanced Practice Providers (APPs -  Physician Assistants and Nurse Practitioners) who all work together to provide you with the care you need, when you need it.  We recommend signing up for the patient portal called "MyChart".  Sign up information is provided on this After Visit Summary.  MyChart is used to connect with patients for Virtual Visits (Telemedicine).  Patients are able to view lab/test results, encounter notes, upcoming appointments, etc.  Non-urgent messages can be sent to your provider as well.   To learn more about what you can do with MyChart, go to [email or web address].    Your next appointment:   1 year(s)  The format for your next appointment:   In Person  Provider:   Pixie Casino, MD       Important Information About Sugar         Signed, Warren Lacy, PA-C  03/11/2022 10:41 AM    [place]

## 2022-03-11 ENCOUNTER — Encounter: Payer: Self-pay | Admitting: Physician Assistant

## 2022-03-11 ENCOUNTER — Ambulatory Visit (INDEPENDENT_AMBULATORY_CARE_PROVIDER_SITE_OTHER): Payer: Medicare HMO | Admitting: Physician Assistant

## 2022-03-11 VITALS — BP 120/58 | HR 60 | Ht 70.0 in | Wt 214.0 lb

## 2022-03-11 DIAGNOSIS — I10 Essential (primary) hypertension: Secondary | ICD-10-CM

## 2022-03-11 DIAGNOSIS — E785 Hyperlipidemia, unspecified: Secondary | ICD-10-CM | POA: Diagnosis not present

## 2022-03-11 NOTE — Addendum Note (Signed)
Addended by: Dionicio Stall E on: 03/11/2022 02:11 PM   Modules accepted: Orders

## 2022-03-11 NOTE — Patient Instructions (Signed)
Medication Instructions:  No Changes *If you need a refill on your cardiac medications before your next appointment, please call your pharmacy*   Lab Work: No Labs If you have labs (blood work) drawn today and your tests are completely normal, you will receive your results only by: [place] (if you have MyChart) OR A paper copy in the mail If you have any lab test that is abnormal or we need to change your treatment, we will call you to review the results.   Testing/Procedures: No Testing   Follow-Up: At [facility], you and your health needs are our priority.  As part of our continuing mission to provide you with exceptional heart care, we have created designated Provider Care Teams.  These Care Teams include your primary Cardiologist (physician) and Advanced Practice Providers (APPs -  Physician Assistants and Nurse Practitioners) who all work together to provide you with the care you need, when you need it.  We recommend signing up for the patient portal called "MyChart".  Sign up information is provided on this After Visit Summary.  MyChart is used to connect with patients for Virtual Visits (Telemedicine).  Patients are able to view lab/test results, encounter notes, upcoming appointments, etc.  Non-urgent messages can be sent to your provider as well.   To learn more about what you can do with MyChart, go to [email or web address].    Your next appointment:   1 year(s)  The format for your next appointment:   In Person  Provider:   Pixie Casino, MD       Important Information About Sugar

## 2022-04-01 DIAGNOSIS — E669 Obesity, unspecified: Secondary | ICD-10-CM | POA: Diagnosis not present

## 2022-04-01 DIAGNOSIS — R69 Illness, unspecified: Secondary | ICD-10-CM | POA: Diagnosis not present

## 2022-04-01 DIAGNOSIS — E785 Hyperlipidemia, unspecified: Secondary | ICD-10-CM | POA: Diagnosis not present

## 2022-04-01 DIAGNOSIS — Z823 Family history of stroke: Secondary | ICD-10-CM | POA: Diagnosis not present

## 2022-04-01 DIAGNOSIS — Z8249 Family history of ischemic heart disease and other diseases of the circulatory system: Secondary | ICD-10-CM | POA: Diagnosis not present

## 2022-04-01 DIAGNOSIS — Z6830 Body mass index (BMI) 30.0-30.9, adult: Secondary | ICD-10-CM | POA: Diagnosis not present

## 2022-04-01 DIAGNOSIS — N529 Male erectile dysfunction, unspecified: Secondary | ICD-10-CM | POA: Diagnosis not present

## 2022-04-01 DIAGNOSIS — Z803 Family history of malignant neoplasm of breast: Secondary | ICD-10-CM | POA: Diagnosis not present

## 2022-04-01 DIAGNOSIS — I10 Essential (primary) hypertension: Secondary | ICD-10-CM | POA: Diagnosis not present

## 2022-04-01 DIAGNOSIS — Z87891 Personal history of nicotine dependence: Secondary | ICD-10-CM | POA: Diagnosis not present

## 2022-04-01 DIAGNOSIS — Z008 Encounter for other general examination: Secondary | ICD-10-CM | POA: Diagnosis not present

## 2022-04-01 DIAGNOSIS — I951 Orthostatic hypotension: Secondary | ICD-10-CM | POA: Diagnosis not present

## 2022-04-01 DIAGNOSIS — R32 Unspecified urinary incontinence: Secondary | ICD-10-CM | POA: Diagnosis not present

## 2022-06-16 ENCOUNTER — Encounter: Payer: Self-pay | Admitting: Gastroenterology

## 2022-07-01 ENCOUNTER — Telehealth: Payer: Self-pay | Admitting: *Deleted

## 2022-07-01 NOTE — Telephone Encounter (Signed)
Dr. 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  According to a telephone encounter to Dr. Earlean Shawl from 2018, pt stated he only has had hyperplastic polyps in the past.  I cannot find a report or pathology results.  Would you like an OV first or is he ok for direct?  Thanks, [employer]

## 2022-07-02 NOTE — Telephone Encounter (Signed)
I see the note you are alluding to.  Looks like in 2018, they did not do a colonoscopy and rather put in for a recall in the system.  If that is the case, and he did not have a colonoscopy in 2018, since he had only hyperplastic polyps and now 5-years later he is up to the 10-year mark, he probably meets criteria for colonoscopy.  If there was a 2018 colonoscopy, that will need to be reviewed first. So if no 2018 colonoscopy performed or if 2018 colonoscopy was performed and adenomas were found, then he will be due and can be direct. If 2018 colonoscopy completed and hyperplastic polyps found then need to see in clinic, if he actually needs a colonoscopy. Thanks. GM

## 2022-07-06 ENCOUNTER — Telehealth: Payer: Self-pay

## 2022-07-06 NOTE — Telephone Encounter (Signed)
Pt states the last colon was in 2013, states they took "2 polyps that were nothing to worry about" pt does not remember if he was supposed to come back in 5 years, but he has not had a colonoscopy since 2013.  Pt states he feels "pretty good for my age" and is not concerned with doing the procedure now. Plan for previsit tomorrow and colon on 08/21/22

## 2022-07-06 NOTE — Telephone Encounter (Signed)
Agree with plan of action. I'll see him for colonoscopy as scheduled. Thanks. GM

## 2022-07-07 ENCOUNTER — Ambulatory Visit (AMBULATORY_SURGERY_CENTER): Payer: Self-pay

## 2022-07-07 VITALS — Ht 70.0 in | Wt 212.0 lb

## 2022-07-07 DIAGNOSIS — Z1211 Encounter for screening for malignant neoplasm of colon: Secondary | ICD-10-CM

## 2022-07-07 NOTE — Progress Notes (Signed)
No egg or soy allergy known to patient;  No issues known to pt with past sedation with any surgeries or procedures; Patient denies ever being told they had issues or difficulty with intubation;  No FH of Malignant Hyperthermia; Pt is not on diet pills; Pt is not on home 02;  Pt is not on blood thinners;  Pt denies issues with constipation;  No A fib or A flutter; Have any cardiac testing pending--NO  Insurance verified during PV appt=[employer] Medicare  Patient's chart reviewed by Osvaldo Angst CNRA prior to previsit and patient appropriate for the [place].  Previsit completed and red dot placed by patient's name on their procedure day (on provider's schedule).

## 2022-07-28 ENCOUNTER — Encounter: Payer: Medicare HMO | Admitting: Gastroenterology

## 2022-07-29 DIAGNOSIS — Z125 Encounter for screening for malignant neoplasm of prostate: Secondary | ICD-10-CM | POA: Diagnosis not present

## 2022-07-29 DIAGNOSIS — E785 Hyperlipidemia, unspecified: Secondary | ICD-10-CM | POA: Diagnosis not present

## 2022-07-29 DIAGNOSIS — R7989 Other specified abnormal findings of blood chemistry: Secondary | ICD-10-CM | POA: Diagnosis not present

## 2022-08-05 DIAGNOSIS — I129 Hypertensive chronic kidney disease with stage 1 through stage 4 chronic kidney disease, or unspecified chronic kidney disease: Secondary | ICD-10-CM | POA: Diagnosis not present

## 2022-08-05 DIAGNOSIS — E785 Hyperlipidemia, unspecified: Secondary | ICD-10-CM | POA: Diagnosis not present

## 2022-08-05 DIAGNOSIS — R413 Other amnesia: Secondary | ICD-10-CM | POA: Diagnosis not present

## 2022-08-05 DIAGNOSIS — G4700 Insomnia, unspecified: Secondary | ICD-10-CM | POA: Diagnosis not present

## 2022-08-05 DIAGNOSIS — N1831 Chronic kidney disease, stage 3a: Secondary | ICD-10-CM | POA: Diagnosis not present

## 2022-08-05 DIAGNOSIS — R69 Illness, unspecified: Secondary | ICD-10-CM | POA: Diagnosis not present

## 2022-08-05 DIAGNOSIS — E669 Obesity, unspecified: Secondary | ICD-10-CM | POA: Diagnosis not present

## 2022-08-05 DIAGNOSIS — Z0000 Encounter for general adult medical examination without abnormal findings: Secondary | ICD-10-CM | POA: Diagnosis not present

## 2022-08-05 DIAGNOSIS — R82998 Other abnormal findings in urine: Secondary | ICD-10-CM | POA: Diagnosis not present

## 2022-08-05 DIAGNOSIS — K635 Polyp of colon: Secondary | ICD-10-CM | POA: Diagnosis not present

## 2022-08-10 DIAGNOSIS — N183 Chronic kidney disease, stage 3 unspecified: Secondary | ICD-10-CM | POA: Diagnosis not present

## 2022-08-18 ENCOUNTER — Encounter: Payer: Self-pay | Admitting: Gastroenterology

## 2022-08-21 ENCOUNTER — Ambulatory Visit (AMBULATORY_SURGERY_CENTER): Payer: Medicare HMO | Admitting: Gastroenterology

## 2022-08-21 ENCOUNTER — Encounter: Payer: Self-pay | Admitting: Gastroenterology

## 2022-08-21 VITALS — BP 93/41 | HR 52 | Temp 97.3°F | Resp 17 | Ht 70.0 in | Wt 212.0 lb

## 2022-08-21 DIAGNOSIS — D120 Benign neoplasm of cecum: Secondary | ICD-10-CM

## 2022-08-21 DIAGNOSIS — N189 Chronic kidney disease, unspecified: Secondary | ICD-10-CM | POA: Diagnosis not present

## 2022-08-21 DIAGNOSIS — Z1211 Encounter for screening for malignant neoplasm of colon: Secondary | ICD-10-CM

## 2022-08-21 DIAGNOSIS — D129 Benign neoplasm of anus and anal canal: Secondary | ICD-10-CM | POA: Diagnosis not present

## 2022-08-21 DIAGNOSIS — R69 Illness, unspecified: Secondary | ICD-10-CM | POA: Diagnosis not present

## 2022-08-21 DIAGNOSIS — D124 Benign neoplasm of descending colon: Secondary | ICD-10-CM | POA: Diagnosis not present

## 2022-08-21 DIAGNOSIS — D127 Benign neoplasm of rectosigmoid junction: Secondary | ICD-10-CM | POA: Diagnosis not present

## 2022-08-21 DIAGNOSIS — D128 Benign neoplasm of rectum: Secondary | ICD-10-CM | POA: Diagnosis not present

## 2022-08-21 DIAGNOSIS — I10 Essential (primary) hypertension: Secondary | ICD-10-CM | POA: Diagnosis not present

## 2022-08-21 MED ORDER — SODIUM CHLORIDE 0.9 % IV SOLN: 500.0000 mL | Freq: Once | INTRAVENOUS | Status: DC

## 2022-08-21 NOTE — Op Note (Signed)
[place] Patient Name: Michael Bishop Procedure Date: 08/21/2022 4:07 PM MRN: [id number] Endoscopist: Justice Britain , MD, [id number] Age: 76 Referring MD:  Date of Birth: [birth year] Gender: Male Account #: [id number] Procedure:                Colonoscopy Indications:              Screening for colorectal malignant neoplasm Medicines:                Monitored Anesthesia Care Procedure:                Pre-Anesthesia Assessment:                           - Prior to the procedure, a History and Physical                            was performed, and patient medications and                            allergies were reviewed. The patient's tolerance of                            previous anesthesia was also reviewed. The risks                            and benefits of the procedure and the sedation                            options and risks were discussed with the patient.                            All questions were answered, and informed consent                            was obtained. Prior Anticoagulants: The patient has                            taken no anticoagulant or antiplatelet agents                            except for aspirin. ASA Grade Assessment: II - A                            patient with mild systemic disease. After reviewing                            the risks and benefits, the patient was deemed in                            satisfactory condition to undergo the procedure.                           After obtaining informed consent, the colonoscope  was passed under direct vision. Throughout the                            procedure, the patient's blood pressure, pulse, and                            oxygen saturations were monitored continuously. The                            CF [id number] #[id number] was introduced through the anus                            and advanced to the the cecum, identified by                             appendiceal orifice and ileocecal valve. The                            colonoscopy was performed without difficulty. The                            patient tolerated the procedure. The quality of the                            bowel preparation was adequate. The ileocecal                            valve, appendiceal orifice, and rectum were                            photographed. Scope In: 4:22:05 PM Scope Out: 4:40:14 PM Scope Withdrawal Time: 0 hours 15 minutes 26 seconds  Total Procedure Duration: 0 hours 18 minutes 9 seconds  Findings:                 The digital rectal exam findings include                            hemorrhoids. Pertinent negatives include no                            palpable rectal lesions.                           The colon (entire examined portion) was                            significantly redundant.                           Four sessile polyps were found in the rectum,                            recto-sigmoid colon, descending colon and cecum.  The polyps were 2 to 5 mm in size. These polyps                            were removed with a cold snare. Resection and                            retrieval were complete.                           Multiple small-mouthed diverticula were found in                            the recto-sigmoid colon and sigmoid colon.                           Normal mucosa was found in the entire colon                            otherwise.                           Non-bleeding non-thrombosed external and internal                            hemorrhoids were found during retroflexion, during                            perianal exam and during digital exam. The                            hemorrhoids were Grade II (internal hemorrhoids                            that prolapse but reduce spontaneously). Complications:            No immediate complications. Estimated Blood Loss:     Estimated blood loss  was minimal. Impression:               - Hemorrhoids found on digital rectal exam.                           - Redundant colon.                           - Four 2 to 5 mm polyps in the rectum, at the                            recto-sigmoid colon, in the descending colon and in                            the cecum, removed with a cold snare. Resected and                            retrieved.                           -  Diverticulosis in the recto-sigmoid colon and in                            the sigmoid colon.                           - Normal mucosa in the entire examined colon                            otherwise.                           - Non-bleeding non-thrombosed external and internal                            hemorrhoids. Recommendation:           - The patient will be observed post-procedure,                            until all discharge criteria are met.                           - Discharge patient to home.                           - Patient has a contact number available for                            emergencies. The signs and symptoms of potential                            delayed complications were discussed with the                            patient. Return to normal activities tomorrow.                            Written discharge instructions were provided to the                            patient.                           - High fiber diet.                           - Use FiberCon 1-2 tablets PO daily.                           - Continue present medications.                           - Await pathology results. If at least 3 polyps are                            adenomatous then could consider 3-5 year follow  up.                            If no adenomatous tissue, then 10-year follow up                            would be recommended but based on patient age will                            not be normally recommended.                           - Repeat  colonoscopy.                           - The findings and recommendations were discussed                            with the patient.                           - The findings and recommendations were discussed                            with the patient's family. Justice Britain, MD 08/21/2022 4:47:45 PM

## 2022-08-21 NOTE — Progress Notes (Signed)
Sedate, gd SR, tolerated procedure well, VSS, report to RN 

## 2022-08-21 NOTE — Progress Notes (Signed)
GASTROENTEROLOGY PROCEDURE H&P NOTE   Primary Care Physician: Prince Solian, MD  HPI: Michael Bishop is a 76 y.o. male who presents for Colonoscopy for screening.  Past Medical History:  Diagnosis Date   Arthritis    fingers   BPH (benign prostatic hyperplasia)    Cataract    not a surgical candidate at this time (07/07/2022)   Chronic kidney disease    patient reports his PCP told him he has "had protein in his urine for years"   Depression    GERD (gastroesophageal reflux disease)    with a big meal then laying down to sleep   Hyperlipidemia    on meds   Hypertension    on meds   Mood disorder ([place])    Seasonal allergic rhinitis    Past Surgical History:  Procedure Laterality Date   COLONOSCOPY  12/2011   patient reports he had 2 HPP's on last colon comepleted by Dr. Earlean Shawl   SHOULDER INJECTION Right    Dr Ninfa Linden; had significant relief   TONSILLECTOMY  1954   Current Outpatient Medications  Medication Sig Dispense Refill   Ascorbic Acid (VITAMIN C) 1000 MG tablet Take 1,000 mg by mouth daily.     aspirin 81 MG chewable tablet Chew 81 mg by mouth daily.      Astaxanthin 5 MG CAPS Take 5 mg by mouth in the morning and at bedtime.     atorvastatin (LIPITOR) 40 MG tablet Take 80 mg by mouth daily.     Cholecalciferol (VITAMIN D3) 50 MCG (2000 UT) TABS Take 3 capsules by mouth in the morning and at bedtime. 2 in AM and 1 in PM     Coenzyme Q10 100 MG capsule Take 200 mg by mouth 2 (two) times daily.     Fish Oil-Krill Oil (KRILL & FISH OIL BLEND PO) Take 1 capsule by mouth in the morning and at bedtime.     Melatonin 10 MG TABS Take 10 mg by mouth at bedtime as needed.     Multiple Vitamin (MULTIVITAMIN) capsule Take 1 capsule by mouth daily.     telmisartan (MICARDIS) 80 MG tablet Take 80 mg by mouth daily.     Resveratrol 100 MG CAPS Take 100 mg by mouth in the morning and at bedtime. (Patient not taking: Reported on 07/07/2022)     Current Facility-Administered  Medications  Medication Dose Route Frequency Provider Last Rate Last Admin   0.9 %  sodium chloride infusion  500 mL Intravenous Once Mansouraty, Telford Nab., MD        Current Outpatient Medications:    Ascorbic Acid (VITAMIN C) 1000 MG tablet, Take 1,000 mg by mouth daily., Disp: , Rfl:    aspirin 81 MG chewable tablet, Chew 81 mg by mouth daily. , Disp: , Rfl:    Astaxanthin 5 MG CAPS, Take 5 mg by mouth in the morning and at bedtime., Disp: , Rfl:    atorvastatin (LIPITOR) 40 MG tablet, Take 80 mg by mouth daily., Disp: , Rfl:    Cholecalciferol (VITAMIN D3) 50 MCG (2000 UT) TABS, Take 3 capsules by mouth in the morning and at bedtime. 2 in AM and 1 in PM, Disp: , Rfl:    Coenzyme Q10 100 MG capsule, Take 200 mg by mouth 2 (two) times daily., Disp: , Rfl:    Fish Oil-Krill Oil (KRILL & FISH OIL BLEND PO), Take 1 capsule by mouth in the morning and at bedtime., Disp: , Rfl:  Melatonin 10 MG TABS, Take 10 mg by mouth at bedtime as needed., Disp: , Rfl:    Multiple Vitamin (MULTIVITAMIN) capsule, Take 1 capsule by mouth daily., Disp: , Rfl:    telmisartan (MICARDIS) 80 MG tablet, Take 80 mg by mouth daily., Disp: , Rfl:    Resveratrol 100 MG CAPS, Take 100 mg by mouth in the morning and at bedtime. (Patient not taking: Reported on 07/07/2022), Disp: , Rfl:   Current Facility-Administered Medications:    0.9 %  sodium chloride infusion, 500 mL, Intravenous, Once, Mansouraty, Telford Nab., MD No Known Allergies Family History  Problem Relation Age of Onset   Hypertension Mother    Hypertension Father    Heart disease Father    Heart disease Brother    Alzheimer's disease Paternal Aunt    Colon polyps Neg Hx    Colon cancer Neg Hx    Esophageal cancer Neg Hx    Stomach cancer Neg Hx    Rectal cancer Neg Hx    Social History   Socioeconomic History   Marital status: Married    Spouse name: Not on file   Number of children: Not on file   Years of education: 16   Highest  education level: Bachelor's degree (e.g., BA, AB, BS)  Occupational History   Not on file  Tobacco Use   Smoking status: Former   Smokeless tobacco: Never  Vaping Use   Vaping Use: Never used  Substance and Sexual Activity   Alcohol use: Not Currently    Alcohol/week: 0.0 - 2.0 standard drinks of alcohol   Drug use: No   Sexual activity: Not on file  Other Topics Concern   Not on file  Social History Narrative   Lives at home with wife and son is there most of the time    Right handed   Caffeine: avg 3 cups/day of half caffeine coffee   Social Determinants of Health   Financial Resource Strain: Not on file  Food Insecurity: Not on file  Transportation Needs: Not on file  Physical Activity: Not on file  Stress: Not on file  Social Connections: Not on file  Intimate Partner Violence: Not on file    Physical Exam: Today's Vitals   08/21/22 1520 08/21/22 1529  BP: 126/61   Pulse: (!) 57   Temp: (!) 97.3 F (36.3 C) (!) 97.3 F (36.3 C)  TempSrc: Temporal   SpO2: 99%   Weight: 212 lb (96.2 kg)   Height: '5\' 10"'$  (1.778 m)    Body mass index is 30.42 kg/m. GEN: NAD EYE: Sclerae anicteric ENT: MMM CV: Non-tachycardic GI: Soft, NT/ND NEURO:  Alert & Oriented x 3  Lab Results: No results for input(s): "WBC", "HGB", "HCT", "PLT" in the last 72 hours. BMET No results for input(s): "NA", "K", "CL", "CO2", "GLUCOSE", "BUN", "CREATININE", "CALCIUM" in the last 72 hours. LFT No results for input(s): "PROT", "ALBUMIN", "AST", "ALT", "ALKPHOS", "BILITOT", "BILIDIR", "IBILI" in the last 72 hours. PT/INR No results for input(s): "LABPROT", "INR" in the last 72 hours.   Impression / Plan: This is a 76 y.o.male who presents for Colonoscopy for screening.  The risks and benefits of endoscopic evaluation/treatment were discussed with the patient and/or family; these include but are not limited to the risk of perforation, infection, bleeding, missed lesions, lack of diagnosis,  severe illness requiring hospitalization, as well as anesthesia and sedation related illnesses.  The patient's history has been reviewed, patient examined, no change in status, and  deemed stable for procedure.  The patient and/or family is agreeable to proceed.    Justice Britain, MD [facility] Advanced [facility]oscopy Office # [id number]

## 2022-08-21 NOTE — Patient Instructions (Signed)
Follow a high fiber diet. Take Fibercon 1-2 tablets by mouth daily. Awaiting pathology results. Repeat Colonoscopy date to be determined based on pathology results.  YOU HAD AN ENDOSCOPIC PROCEDURE TODAY AT [facility]:   Refer to the procedure report that was given to you for any specific questions about what was found during the examination.  If the procedure report does not answer your questions, please call your gastroenterologist to clarify.  If you requested that your care partner not be given the details of your procedure findings, then the procedure report has been included in a sealed envelope for you to review at your convenience later.  YOU SHOULD EXPECT: Some feelings of bloating in the abdomen. Passage of more gas than usual.  Walking can help get rid of the air that was put into your GI tract during the procedure and reduce the bloating. If you had a lower endoscopy (such as a colonoscopy or flexible sigmoidoscopy) you may notice spotting of blood in your stool or on the toilet paper. If you underwent a bowel prep for your procedure, you may not have a normal bowel movement for a few days.  Please Note:  You might notice some irritation and congestion in your nose or some drainage.  This is from the oxygen used during your procedure.  There is no need for concern and it should clear up in a day or so.  SYMPTOMS TO REPORT IMMEDIATELY:  Following lower endoscopy (colonoscopy or flexible sigmoidoscopy):  Excessive amounts of blood in the stool  Significant tenderness or worsening of abdominal pains  Swelling of the abdomen that is new, acute  Fever of 100F or higher  For urgent or emergent issues, a gastroenterologist can be reached at any hour by calling [phone]. Do not use MyChart messaging for urgent concerns.    DIET:  We do recommend a small meal at first, but then you may proceed to your regular diet.  Drink plenty of fluids but you should avoid alcoholic  beverages for 24 hours.  ACTIVITY:  You should plan to take it easy for the rest of today and you should NOT DRIVE or use heavy machinery until tomorrow (because of the sedation medicines used during the test).    FOLLOW UP: Our staff will call the number listed on your records the next business day following your procedure.  We will call around 7:15- 8:00 am to check on you and address any questions or concerns that you may have regarding the information given to you following your procedure. If we do not reach you, we will leave a message.     If any biopsies were taken you will be contacted by phone or by letter within the next 1-3 weeks.  Please call us at [phone] if you have not heard about the biopsies in 3 weeks.    SIGNATURES/CONFIDENTIALITY: You and/or your care partner have signed paperwork which will be entered into your electronic medical record.  These signatures attest to the fact that that the information above on your After Visit Summary has been reviewed and is understood.  Full responsibility of the confidentiality of this discharge information lies with you and/or your care-partner.

## 2022-08-21 NOTE — Progress Notes (Signed)
Called to room to assist during endoscopic procedure.  Patient ID and intended procedure confirmed with present staff. Received instructions for my participation in the procedure from the performing physician.  

## 2022-08-26 ENCOUNTER — Telehealth: Payer: Self-pay

## 2022-08-26 NOTE — Telephone Encounter (Signed)
Patient called, stated he was doing fine. He has no further questions at this time.

## 2022-08-26 NOTE — Telephone Encounter (Signed)
Attempted f/u call. No answer, left VM. 

## 2022-09-02 ENCOUNTER — Encounter: Payer: Self-pay | Admitting: Gastroenterology

## 2022-09-16 DIAGNOSIS — H524 Presbyopia: Secondary | ICD-10-CM | POA: Diagnosis not present

## 2022-09-16 DIAGNOSIS — H2513 Age-related nuclear cataract, bilateral: Secondary | ICD-10-CM | POA: Diagnosis not present

## 2022-09-16 DIAGNOSIS — H52203 Unspecified astigmatism, bilateral: Secondary | ICD-10-CM | POA: Diagnosis not present

## 2022-09-16 DIAGNOSIS — H5203 Hypermetropia, bilateral: Secondary | ICD-10-CM | POA: Diagnosis not present

## 2022-10-01 ENCOUNTER — Emergency Department (HOSPITAL_BASED_OUTPATIENT_CLINIC_OR_DEPARTMENT_OTHER): Payer: Medicare HMO | Admitting: Radiology

## 2022-10-01 ENCOUNTER — Encounter (HOSPITAL_BASED_OUTPATIENT_CLINIC_OR_DEPARTMENT_OTHER): Payer: Self-pay | Admitting: Emergency Medicine

## 2022-10-01 ENCOUNTER — Emergency Department (HOSPITAL_BASED_OUTPATIENT_CLINIC_OR_DEPARTMENT_OTHER)
Admission: EM | Admit: 2022-10-01 | Discharge: 2022-10-01 | Disposition: A | Discharge status: Home / Self Care | Payer: Medicare HMO | Attending: Emergency Medicine | Admitting: Emergency Medicine

## 2022-10-01 ENCOUNTER — Other Ambulatory Visit: Payer: Self-pay

## 2022-10-01 DIAGNOSIS — J210 Acute bronchiolitis due to respiratory syncytial virus: Secondary | ICD-10-CM | POA: Insufficient documentation

## 2022-10-01 DIAGNOSIS — J205 Acute bronchitis due to respiratory syncytial virus: Secondary | ICD-10-CM

## 2022-10-01 DIAGNOSIS — Z20822 Contact with and (suspected) exposure to covid-19: Secondary | ICD-10-CM | POA: Diagnosis not present

## 2022-10-01 DIAGNOSIS — I10 Essential (primary) hypertension: Secondary | ICD-10-CM | POA: Insufficient documentation

## 2022-10-01 DIAGNOSIS — R059 Cough, unspecified: Secondary | ICD-10-CM | POA: Diagnosis not present

## 2022-10-01 DIAGNOSIS — Z7982 Long term (current) use of aspirin: Secondary | ICD-10-CM | POA: Diagnosis not present

## 2022-10-01 DIAGNOSIS — Z79899 Other long term (current) drug therapy: Secondary | ICD-10-CM | POA: Diagnosis not present

## 2022-10-01 LAB — CBC WITH DIFFERENTIAL/PLATELET
Abs Immature Granulocytes: 0.03 10*3/uL (ref 0.00–0.07)
Basophils Absolute: 0 10*3/uL (ref 0.0–0.1)
Basophils Relative: 0 %
Eosinophils Absolute: 0 10*3/uL (ref 0.0–0.5)
Eosinophils Relative: 0 %
HCT: 42.3 % (ref 39.0–52.0)
Hemoglobin: 14.4 g/dL (ref 13.0–17.0)
Immature Granulocytes: 0 %
Lymphocytes Relative: 11 %
Lymphs Abs: 1 10*3/uL (ref 0.7–4.0)
MCH: 30.2 pg (ref 26.0–34.0)
MCHC: 34 g/dL (ref 30.0–36.0)
MCV: 88.7 fL (ref 80.0–100.0)
Monocytes Absolute: 1.2 10*3/uL — ABNORMAL HIGH (ref 0.1–1.0)
Monocytes Relative: 13 %
Neutro Abs: 6.8 10*3/uL (ref 1.7–7.7)
Neutrophils Relative %: 76 %
Platelets: 195 10*3/uL (ref 150–400)
RBC: 4.77 MIL/uL (ref 4.22–5.81)
RDW: 13.4 % (ref 11.5–15.5)
WBC: 9.2 10*3/uL (ref 4.0–10.5)
nRBC: 0 % (ref 0.0–0.2)

## 2022-10-01 LAB — COMPREHENSIVE METABOLIC PANEL
ALT: 28 U/L (ref 0–44)
AST: 22 U/L (ref 15–41)
Albumin: 4.1 g/dL (ref 3.5–5.0)
Alkaline Phosphatase: 88 U/L (ref 38–126)
Anion gap: 11 (ref 5–15)
BUN: 22 mg/dL (ref 8–23)
CO2: 25 mmol/L (ref 22–32)
Calcium: 9.1 mg/dL (ref 8.9–10.3)
Chloride: 99 mmol/L (ref 98–111)
Creatinine, Ser: 1.43 mg/dL — ABNORMAL HIGH (ref 0.61–1.24)
GFR, Estimated: 50 mL/min — ABNORMAL LOW (ref >60)
Glucose, Bld: 99 mg/dL (ref 70–99)
Potassium: 4 mmol/L (ref 3.5–5.1)
Sodium: 135 mmol/L (ref 135–145)
Total Bilirubin: 0.5 mg/dL (ref 0.3–1.2)
Total Protein: 7 g/dL (ref 6.5–8.1)

## 2022-10-01 LAB — RESP PANEL BY RT-PCR (RSV, FLU A&B, COVID)  RVPGX2
Influenza A by PCR: NEGATIVE
Influenza B by PCR: NEGATIVE
Resp Syncytial Virus by PCR: POSITIVE — AB
SARS Coronavirus 2 by RT PCR: NEGATIVE

## 2022-10-01 MED ORDER — PREDNISONE 20 MG PO TABS: 40.0000 mg | ORAL_TABLET | Freq: Every day | ORAL | 0 refills | Status: AC

## 2022-10-01 MED ORDER — ALBUTEROL SULFATE HFA 108 (90 BASE) MCG/ACT IN AERS: 1.0000 | INHALATION_SPRAY | Freq: Four times a day (QID) | RESPIRATORY_TRACT | 0 refills | Status: AC | PRN

## 2022-10-01 MED ORDER — AZITHROMYCIN 250 MG PO TABS: 250.0000 mg | ORAL_TABLET | Freq: Every day | ORAL | 0 refills | Status: AC

## 2022-10-01 MED ORDER — IPRATROPIUM-ALBUTEROL 0.5-2.5 (3) MG/3ML IN SOLN
3.0000 mL | Freq: Once | RESPIRATORY_TRACT | Status: AC
  Administered 2022-10-01: 3 mL via RESPIRATORY_TRACT
  Filled 2022-10-01: qty 3

## 2022-10-01 MED ORDER — METHYLPREDNISOLONE SODIUM SUCC 125 MG IJ SOLR
125.0000 mg | Freq: Once | INTRAMUSCULAR | Status: AC
  Administered 2022-10-01: 125 mg via INTRAVENOUS
  Filled 2022-10-01: qty 2

## 2022-10-01 NOTE — Discharge Instructions (Signed)
You were seen in the ER today for evaluation for cough and cold symptoms.  You tested positive for RSV.  Your x-ray was concerning for some pneumonia.  I would like you to continue the cefdinir that you were given by your primary care doctor.  I am adding on an additional antibiotic to this called azithromycin.  Please take as prescribed.  Additionally, I am sending you home with some prednisone to take to help with inflammation and your cough.  I am also sending you home with an albuterol inhaler to use to help with your cough as well.  I like you to follow-up with your primary care doctor in the next few days for reevaluation.  If you not feel better within the next few days or feel worse in any time, please return to the nearest emergency room for evaluation.  You may concerns, new or worsening symptoms, please turn to the nearest emergency room for reevaluation.  Contact a doctor if: Your symptoms do not get better in 2 weeks. You have trouble coughing up the mucus. Your cough keeps you awake at night. You have a fever. Get help right away if: You cough up blood. You have chest pain. You have very bad shortness of breath. You faint or keep feeling like you are going to faint. You have a very bad headache. Your fever or chills get worse. These symptoms may be an emergency. Get help right away. Call your local emergency services (911 in the [place] Do not wait to see if the symptoms will go away. Do not drive yourself to the hospital.

## 2022-10-01 NOTE — Progress Notes (Signed)
RT ambulated the Pt on RA. His SATS 94% and HR 85.

## 2022-10-01 NOTE — ED Triage Notes (Signed)
Fatigue, feeling run down, cough. X 4 days Exposed to RSV Abt called in by pcp on tuesday

## 2022-10-01 NOTE — ED Provider Notes (Signed)
[facility] Provider Note   CSN: [id number] Arrival date & time: 10/01/22  1447     History Chief Complaint  Patient presents with   Fatigue    Michael Bishop is a 77 y.o. male with h/o HTN and HLD presents to the ER for evaluation of cough and cold symptoms for the past 5 days. The patient reports he has been around his wife in the nursing home who tested positive for RSV and he thinks this is what he had. He called his PCP on Tuesday, two days after symptoms and was prescribed Cefdinir. The patient is complaining of more wheezing with a productive cough.  No fevers.  Reports some rhinorrhea and nasal congestion.  No sore throat, abdominal pain, nausea, vomiting, chest pain, shortness of breath, or leg swelling.  No known drug allergies.  Former smoker of 50 years ago.  He was instructed to come in from his primary care doctor for more rapid testing for RSV. HPI     Home Medications Prior to Admission medications   Medication Sig Start Date End Date Taking? Authorizing Provider  Ascorbic Acid (VITAMIN C) 1000 MG tablet Take 1,000 mg by mouth daily.    [provider]  aspirin 81 MG chewable tablet Chew 81 mg by mouth daily.     [provider]  Astaxanthin 5 MG CAPS Take 5 mg by mouth in the morning and at bedtime.    [provider]  atorvastatin (LIPITOR) 80 MG tablet Take 80 mg by mouth daily. 07/24/22   [provider]  Cholecalciferol (VITAMIN D3) 50 MCG (2000 UT) TABS Take 3 capsules by mouth in the morning and at bedtime. 2 in AM and 1 in PM    [provider]  Coenzyme Q10 100 MG capsule Take 200 mg by mouth 2 (two) times daily.    [provider]  Fish Oil-Krill Oil (KRILL & FISH OIL BLEND PO) Take 1 capsule by mouth in the morning and at bedtime.    [provider]  Melatonin 10 MG TABS Take 10 mg by mouth at bedtime as needed.    [provider]  Multiple Vitamin  (MULTIVITAMIN) capsule Take 1 capsule by mouth daily.    [provider]  Resveratrol 100 MG CAPS Take 100 mg by mouth in the morning and at bedtime. Patient not taking: Reported on 07/07/2022    [provider]  telmisartan (MICARDIS) 80 MG tablet Take 80 mg by mouth daily. 05/15/22   [provider]      Allergies    Patient has no known allergies.    Review of Systems   Review of Systems  Constitutional:  Positive for fatigue. Negative for chills and fever.  HENT:  Positive for congestion and rhinorrhea. Negative for sore throat.   Respiratory:  Positive for cough and wheezing. Negative for shortness of breath.   Cardiovascular:  Negative for chest pain.  Gastrointestinal:  Negative for abdominal pain, constipation, diarrhea, nausea and vomiting.    Physical Exam Updated Vital Signs BP 131/61 (BP Location: Right Arm)   Pulse 71   Temp 97.6 F (36.4 C) (Oral)   Resp 20   SpO2 94%  Physical Exam Vitals and nursing note reviewed.  Constitutional:      General: He is not in acute distress.    Appearance: He is not ill-appearing or toxic-appearing.  HENT:     Head: Normocephalic and atraumatic.  Nose:     Comments: Bilateral nasal turbinate edema and erythema with scant clear nasal discharge.    Mouth/Throat:     Mouth: Mucous membranes are moist.     Comments: No pharyngeal erythema, exudate, or edema noted.  Uvula midline.  Airway patent.  Moist mucous membranes. Eyes:     General: No scleral icterus.    Conjunctiva/sclera: Conjunctivae normal.  Cardiovascular:     Rate and Rhythm: Normal rate.  Pulmonary:     Effort: Pulmonary effort is normal. No respiratory distress.     Breath sounds: Wheezing present.     Comments: Expiratory wheezing heard R>L. The patient is speaking in full sentences with ease and satting well on RA without any increase work of breathing Musculoskeletal:        General: No deformity.     Cervical back: Normal range  of motion.  Skin:    General: Skin is warm and dry.  Neurological:     General: No focal deficit present.     Mental Status: He is alert.     ED Results / Procedures / Treatments   Labs (all labs ordered are listed, but only abnormal results are displayed) Labs Reviewed  RESP PANEL BY RT-PCR (RSV, FLU A&B, COVID)  RVPGX2 - Abnormal; Notable for the following components:      Result Value   Resp Syncytial Virus by PCR POSITIVE (*)    All other components within normal limits  CBC WITH DIFFERENTIAL/PLATELET - Abnormal; Notable for the following components:   Monocytes Absolute 1.2 (*)    All other components within normal limits  COMPREHENSIVE METABOLIC PANEL - Abnormal; Notable for the following components:   Creatinine, Ser 1.43 (*)    GFR, Estimated 50 (*)    All other components within normal limits  URINALYSIS, ROUTINE W REFLEX MICROSCOPIC    EKG None  Radiology DG Chest 2 View  Result Date: 10/01/2022 CLINICAL DATA:  cough EXAM: CHEST - 2 VIEW COMPARISON:  None Available. FINDINGS: The heart size and mediastinal contours are within normal limits. There is diffuse pulmonary interstitial prominence which could be seen with atypical infection, edema or interstitial lung disease. There is no focal consolidation. No pneumothorax or pleural effusion. Aorta is calcified. The visualized osseous structures are unremarkable. IMPRESSION: Nonspecific interstitial prominence consistent with atypical infection, edema or interstitial lung disease. No focal consolidation. Electronically Signed   By: Sammie Bench M.D.   On: 10/01/2022 16:08    Procedures Procedures   Medications Ordered in ED Medications  ipratropium-albuterol (DUONEB) 0.5-2.5 (3) MG/3ML nebulizer solution 3 mL (has no administration in time range)  ipratropium-albuterol (DUONEB) 0.5-2.5 (3) MG/3ML nebulizer solution 3 mL (3 mLs Nebulization Given 10/01/22 1605)    ED Course/ Medical Decision Making/ A&P                            Medical Decision Making Amount and/or Complexity of Data Reviewed Labs: ordered. Radiology: ordered.  Risk Prescription drug management.   77 y/o M presents to the ER for evaluation of cough and cold symptoms.  Differential diagnosis includes was not limited to bronchitis, pneumonia, COVID, flu, RSV, viral illness. Vital signs are unremarkable. Physical exam as noted above.   I independently reviewed and interpreted the patient's labs. CBC without leukocytosis or anemia. Slightly elevated monocyte count. Positive for RSV. CMP shows Cr at 1.43, last Cr being 1.23 5 years prior.   CXR shows  Nonspecific  interstitial prominence consistent with atypical infection, edema or interstitial lung disease. No focal consolidation.  Patient was initially given a DuoNeb. On re-eval, the patient's wheezing greatly improved, but some was still present. Repeat DuoNeb ordered.   Patient is now having wheezing on the right. Solumedrol ordered.  On re-evaluation, the patient has very minimal wheezing at the right lower base. SpO2 in the room at 94%.   The patient is currently on cefdinir from his primary care doctor.  I will add on some azithromycin for coverage for pneumonia.  Will also send him home with a prednisone burst as well as an albuterol inhaler.  His ambulatory pulse ox was at 94% and was walking briskly.  He is speaking in full sentences with ease.  No respiratory distress.  No accessory muscle use, nasal flaring, or tripoding.  I considered admission however patient has reassuring vitals, reassuring ambulatory vitals as well as labs.  His lung sounds are improved after Solu-Medrol and breathing treatments.  I do not think this patient meets any criteria for admission at this time and can be discharged home.  Given his new add-on of the antibiotic, prednisone, and albuterol inhaler, the patient is stable for discharge home.  I discussed with him strict return precautions red flag  symptoms.  Discussed with him at length about his new medications.  He verbalizes understanding and agrees to the plan.  Patient is stable and being discharged home in good condition.  I discussed this case with my attending physician who cosigned this note including patient's presenting symptoms, physical exam, and planned diagnostics and interventions. Attending physician stated agreement with plan or made changes to plan which were implemented.   Attending physician assessed patient at bedside.   Final Clinical Impression(s) / ED Diagnoses Final diagnoses:  RSV bronchitis    Rx / DC Orders ED Discharge Orders          Ordered    predniSONE (DELTASONE) 20 MG tablet  Daily        10/01/22 1725    albuterol (VENTOLIN HFA) 108 (90 Base) MCG/ACT inhaler  Every 6 hours PRN        10/01/22 1725    azithromycin (ZITHROMAX) 250 MG tablet  Daily        10/01/22 1725              Sherrell Puller, [place] 10/01/22 1915    Fransico Meadow, MD 10/03/22 [phone]

## 2022-10-01 NOTE — ED Notes (Signed)
Patient transported to X-ray 

## 2022-10-01 NOTE — ED Notes (Signed)
Discharge instructions, follow up care, and prescriptions reviewed and explained, pt verbalized understanding and had no further questions on d/c. Pt caox4, ambulatory, and NAD on d/c.

## 2022-10-07 ENCOUNTER — Other Ambulatory Visit: Payer: Self-pay

## 2022-10-07 ENCOUNTER — Emergency Department (HOSPITAL_BASED_OUTPATIENT_CLINIC_OR_DEPARTMENT_OTHER)
Admission: EM | Admit: 2022-10-07 | Discharge: 2022-10-07 | Disposition: A | Discharge status: Home / Self Care | Payer: Medicare HMO | Attending: Emergency Medicine | Admitting: Emergency Medicine

## 2022-10-07 ENCOUNTER — Encounter (HOSPITAL_BASED_OUTPATIENT_CLINIC_OR_DEPARTMENT_OTHER): Payer: Self-pay

## 2022-10-07 ENCOUNTER — Emergency Department (HOSPITAL_BASED_OUTPATIENT_CLINIC_OR_DEPARTMENT_OTHER): Payer: Medicare HMO | Admitting: Radiology

## 2022-10-07 DIAGNOSIS — J40 Bronchitis, not specified as acute or chronic: Secondary | ICD-10-CM | POA: Insufficient documentation

## 2022-10-07 DIAGNOSIS — Z7982 Long term (current) use of aspirin: Secondary | ICD-10-CM | POA: Insufficient documentation

## 2022-10-07 DIAGNOSIS — R059 Cough, unspecified: Secondary | ICD-10-CM | POA: Diagnosis not present

## 2022-10-07 DIAGNOSIS — I10 Essential (primary) hypertension: Secondary | ICD-10-CM | POA: Insufficient documentation

## 2022-10-07 DIAGNOSIS — Z79899 Other long term (current) drug therapy: Secondary | ICD-10-CM | POA: Diagnosis not present

## 2022-10-07 DIAGNOSIS — R0602 Shortness of breath: Secondary | ICD-10-CM | POA: Diagnosis not present

## 2022-10-07 MED ORDER — ALBUTEROL SULFATE (2.5 MG/3ML) 0.083% IN NEBU
2.5000 mg | INHALATION_SOLUTION | Freq: Once | RESPIRATORY_TRACT | Status: AC
  Administered 2022-10-07: 2.5 mg via RESPIRATORY_TRACT
  Filled 2022-10-07: qty 3

## 2022-10-07 MED ORDER — PREDNISONE 10 MG PO TABS: 40.0000 mg | ORAL_TABLET | Freq: Every day | ORAL | 0 refills | Status: AC

## 2022-10-07 NOTE — Discharge Instructions (Signed)
You were seen in the emergency department for your cough.  Your chest x-ray showed inflammation of your lungs likely consistent with your recent viral infection but no signs of pneumonia.  You did have wheezing that is consistent with bronchitis.  You should continue to use your albuterol as needed for your shortness of breath and I have given you a short course of steroids.  You should follow-up with your primary doctor on Monday as planned.  You should return to the emergency department for significantly worsening shortness of breath, severe chest pains or if you have any other new or concerning symptoms.

## 2022-10-07 NOTE — ED Triage Notes (Signed)
He states he had a recent bout of RSV. He feels better, however, c/o persistent , nagging shortness of breath. His skin is normal, warm and dry.

## 2022-10-07 NOTE — ED Provider Notes (Signed)
[facility] Provider Note   CSN: [id number] Arrival date & time: 10/07/22  1222     History  Chief Complaint  Patient presents with   Shortness of Breath    Michael Bishop is a 77 y.o. male.  Patient is a 77 year old male with a past medical history of hypertension, and hyperlipidemia presenting to the emergency department with cough.  Patient states that he was seen in the ED last week and was diagnosed with RSV.  He states that he was laced on a Z-Pak and steroids and was given an albuterol inhaler.  He states he completed the Z-Pak and steroids yesterday but has not used the inhaler.  He states that he continues to be coughing with mild shortness of breath.  He denies any chest pain.  He denies any fevers or chills.  He states that he felt like he needed to be rechecked to make sure that he does not have pneumonia.  The history is provided by the patient.  Shortness of Breath      Home Medications Prior to Admission medications   Medication Sig Start Date End Date Taking? Authorizing Provider  predniSONE (DELTASONE) 10 MG tablet Take 4 tablets (40 mg total) by mouth daily for 4 days. 10/07/22 10/11/22 Yes Maylon Peppers, Jordan Hawks K, DO  albuterol (VENTOLIN HFA) 108 (90 Base) MCG/ACT inhaler Inhale 1-2 puffs into the lungs every 6 (six) hours as needed for wheezing or shortness of breath. 10/01/22   Sherrell Puller, PA-C  Ascorbic Acid (VITAMIN C) 1000 MG tablet Take 1,000 mg by mouth daily.    [provider]  aspirin 81 MG chewable tablet Chew 81 mg by mouth daily.     [provider]  Astaxanthin 5 MG CAPS Take 5 mg by mouth in the morning and at bedtime.    [provider]  atorvastatin (LIPITOR) 80 MG tablet Take 80 mg by mouth daily. 07/24/22   [provider]  azithromycin (ZITHROMAX) 250 MG tablet Take 1 tablet (250 mg total) by mouth daily. Take first 2 tablets together, then 1 every day until finished.  10/01/22   Sherrell Puller, PA-C  cefdinir (OMNICEF) 300 MG capsule Take 300 mg by mouth 2 (two) times daily. 09/28/22   [provider]  Cholecalciferol (VITAMIN D3) 50 MCG (2000 UT) TABS Take 3 capsules by mouth in the morning and at bedtime. 2 in AM and 1 in PM    [provider]  Coenzyme Q10 100 MG capsule Take 200 mg by mouth 2 (two) times daily.    [provider]  Fish Oil-Krill Oil (KRILL & FISH OIL BLEND PO) Take 1 capsule by mouth in the morning and at bedtime.    [provider]  Melatonin 10 MG TABS Take 10 mg by mouth at bedtime as needed.    [provider]  Multiple Vitamin (MULTIVITAMIN) capsule Take 1 capsule by mouth daily.    [provider]  Resveratrol 100 MG CAPS Take 100 mg by mouth in the morning and at bedtime. Patient not taking: Reported on 07/07/2022    [provider]  telmisartan (MICARDIS) 80 MG tablet Take 80 mg by mouth daily. 05/15/22   [provider]      Allergies    Patient has no known allergies.    Review of Systems   Review of Systems  Respiratory:  Positive for shortness of breath.     Physical Exam Updated Vital Signs BP Marland Kitchen)  127/58 (BP Location: Right Arm)   Pulse (!) 104   Temp 97.9 F (36.6 C)   Resp 20   SpO2 94%  Physical Exam Vitals and nursing note reviewed.  Constitutional:      General: He is not in acute distress.    Appearance: He is well-developed. He is obese.  HENT:     Head: Normocephalic and atraumatic.     Mouth/Throat:     Mouth: Mucous membranes are moist.     Pharynx: Oropharynx is clear.  Eyes:     Extraocular Movements: Extraocular movements intact.  Cardiovascular:     Rate and Rhythm: Normal rate and regular rhythm.     Pulses: Normal pulses.     Heart sounds: Normal heart sounds.  Pulmonary:     Effort: Pulmonary effort is normal.     Breath sounds: Examination of the right-upper field reveals wheezing. Examination of the left-upper field  reveals wheezing. Examination of the right-middle field reveals wheezing. Examination of the left-middle field reveals wheezing. Examination of the right-lower field reveals wheezing. Examination of the left-lower field reveals wheezing. Wheezing (Diffuse expiratory wheeze with bronchogenic of) present.  Abdominal:     Palpations: Abdomen is soft.     Tenderness: There is no abdominal tenderness.  Musculoskeletal:        General: Normal range of motion.     Cervical back: Normal range of motion and neck supple.     Right lower leg: No edema.     Left lower leg: No edema.  Skin:    General: Skin is warm and dry.  Neurological:     General: No focal deficit present.     Mental Status: He is alert and oriented to person, place, and time.  Psychiatric:        Mood and Affect: Mood normal.        Behavior: Behavior normal.     ED Results / Procedures / Treatments   Labs (all labs ordered are listed, but only abnormal results are displayed) Labs Reviewed - No data to display  EKG EKG Interpretation  Date/Time:  Wednesday October 07 2022 12:35:56 EST Ventricular Rate:  96 PR Interval:  136 QRS Duration: 95 QT Interval:  351 QTC Calculation: 444 R Axis:   36 Text Interpretation: Sinus rhythm Atrial premature complex Abnormal R-wave progression, early transition No previous ECGs available Confirmed by Leanord Asal ([id number]) on 10/07/2022 2:44:07 PM  Radiology DG Chest 2 View  Result Date: 10/07/2022 CLINICAL DATA:  Provided history: Cough. Shortness of breath. Patient diagnosed with RSV last week. EXAM: CHEST - 2 VIEW COMPARISON:  Chest radiographs 10/01/2022. FINDINGS: Heart size within normal limits. Prominence of the interstitial markings within the mid and lower lungs bilaterally, similar to the prior chest radiographs of 10/01/2022. No evidence of pleural effusion or pneumothorax. No acute osseous abnormality identified. Degenerative changes of the spine. IMPRESSION: Prominence  of the interstitial markings within the mid and lower lungs bilaterally, similar to the prior chest radiographs 10/01/2022. Findings are nonspecific and may reflect atypical/viral infection or chronic interstitial lung disease. Interstitial edema is possible, but considered less likely. Electronically Signed   By: Kellie Simmering D.O.   On: 10/07/2022 13:37    Procedures Procedures    Medications Ordered in ED Medications  albuterol (PROVENTIL) (2.5 MG/3ML) 0.083% nebulizer solution 2.5 mg (2.5 mg Nebulization Given 10/07/22 1353)    ED Course/ Medical Decision Making/ A&P Clinical Course as of 10/07/22 1522  Wed Oct 07, 2022  1500 Upon reassessment, the patient's wheezing is completely resolved and he reports improvement of his symptoms.  He is stable for discharge home with treatment of bronchitis and is given outpatient primary care follow-up. [VK]    Clinical Course User Index [VK] Kemper Durie, DO                             Medical Decision Making This patient presents to the ED with chief complaint(s) of cough with pertinent past medical history of hypertension, hyperlipidemia, recent RSV infection which further complicates the presenting complaint. The complaint involves an extensive differential diagnosis and also carries with it a high risk of complications and morbidity.    The differential diagnosis includes bronchitis, pneumonia, pneumothorax, pulmonary edema, pleural effusion  Additional history obtained: Additional history obtained from N/A Records reviewed recent ED records  ED Course and Reassessment: On patient's arrival to the emergency department he is awake alert and well-appearing in no acute respiratory distress with no increased work of breathing.  He does have wheezing on exam and a bronchogenic cough, concerning for bronchitis and he will be given an albuterol nebulizer.  EKG performed on arrival no acute ischemic changes.  Chest x-ray will be performed to  evaluate for pneumonia.  Independent labs interpretation:  N/A  Independent visualization of imaging: - I independently visualized the following imaging with scope of interpretation limited to determining acute life threatening conditions related to emergency care: CXR, which revealed viral interstitial disease  Consultation: - Consulted or discussed management/test interpretation w/ external professional: N/A  Consideration for admission or further workup: Patient has no emergent conditions requiring admission or further work-up at this time and is stable for discharge home with primary care follow-up  Social Determinants of health: N/A    Amount and/or Complexity of Data Reviewed Radiology: ordered.  Risk Prescription drug management.          Final Clinical Impression(s) / ED Diagnoses Final diagnoses:  Bronchitis    Rx / DC Orders ED Discharge Orders          Ordered    predniSONE (DELTASONE) 10 MG tablet  Daily        10/07/22 1510              Kemper Durie, DO 10/07/22 1522

## 2022-10-12 DIAGNOSIS — I129 Hypertensive chronic kidney disease with stage 1 through stage 4 chronic kidney disease, or unspecified chronic kidney disease: Secondary | ICD-10-CM | POA: Diagnosis not present

## 2022-10-12 DIAGNOSIS — J40 Bronchitis, not specified as acute or chronic: Secondary | ICD-10-CM | POA: Diagnosis not present

## 2022-10-12 DIAGNOSIS — B338 Other specified viral diseases: Secondary | ICD-10-CM | POA: Diagnosis not present

## 2022-10-12 DIAGNOSIS — R5383 Other fatigue: Secondary | ICD-10-CM | POA: Diagnosis not present

## 2022-10-19 DIAGNOSIS — L814 Other melanin hyperpigmentation: Secondary | ICD-10-CM | POA: Diagnosis not present

## 2022-10-19 DIAGNOSIS — L570 Actinic keratosis: Secondary | ICD-10-CM | POA: Diagnosis not present

## 2022-10-19 DIAGNOSIS — L821 Other seborrheic keratosis: Secondary | ICD-10-CM | POA: Diagnosis not present

## 2022-10-19 DIAGNOSIS — D225 Melanocytic nevi of trunk: Secondary | ICD-10-CM | POA: Diagnosis not present

## 2023-02-11 DIAGNOSIS — K219 Gastro-esophageal reflux disease without esophagitis: Secondary | ICD-10-CM | POA: Diagnosis not present

## 2023-02-11 DIAGNOSIS — I951 Orthostatic hypotension: Secondary | ICD-10-CM | POA: Diagnosis not present

## 2023-02-11 DIAGNOSIS — Z6831 Body mass index (BMI) 31.0-31.9, adult: Secondary | ICD-10-CM | POA: Diagnosis not present

## 2023-02-11 DIAGNOSIS — E785 Hyperlipidemia, unspecified: Secondary | ICD-10-CM | POA: Diagnosis not present

## 2023-02-11 DIAGNOSIS — I10 Essential (primary) hypertension: Secondary | ICD-10-CM | POA: Diagnosis not present

## 2023-02-11 DIAGNOSIS — Z87891 Personal history of nicotine dependence: Secondary | ICD-10-CM | POA: Diagnosis not present

## 2023-02-11 DIAGNOSIS — E669 Obesity, unspecified: Secondary | ICD-10-CM | POA: Diagnosis not present

## 2023-08-24 DIAGNOSIS — E785 Hyperlipidemia, unspecified: Secondary | ICD-10-CM | POA: Diagnosis not present

## 2023-08-24 DIAGNOSIS — Z1212 Encounter for screening for malignant neoplasm of rectum: Secondary | ICD-10-CM | POA: Diagnosis not present

## 2023-09-02 DIAGNOSIS — I129 Hypertensive chronic kidney disease with stage 1 through stage 4 chronic kidney disease, or unspecified chronic kidney disease: Secondary | ICD-10-CM | POA: Diagnosis not present

## 2023-09-02 DIAGNOSIS — Z0000 Encounter for general adult medical examination without abnormal findings: Secondary | ICD-10-CM | POA: Diagnosis not present

## 2023-09-02 DIAGNOSIS — R413 Other amnesia: Secondary | ICD-10-CM | POA: Diagnosis not present

## 2023-09-02 DIAGNOSIS — Z1331 Encounter for screening for depression: Secondary | ICD-10-CM | POA: Diagnosis not present

## 2023-09-02 DIAGNOSIS — R82998 Other abnormal findings in urine: Secondary | ICD-10-CM | POA: Diagnosis not present

## 2023-09-02 DIAGNOSIS — K635 Polyp of colon: Secondary | ICD-10-CM | POA: Diagnosis not present

## 2023-09-02 DIAGNOSIS — N1831 Chronic kidney disease, stage 3a: Secondary | ICD-10-CM | POA: Diagnosis not present

## 2023-09-02 DIAGNOSIS — E669 Obesity, unspecified: Secondary | ICD-10-CM | POA: Diagnosis not present

## 2023-09-02 DIAGNOSIS — R972 Elevated prostate specific antigen [PSA]: Secondary | ICD-10-CM | POA: Diagnosis not present

## 2023-09-02 DIAGNOSIS — E785 Hyperlipidemia, unspecified: Secondary | ICD-10-CM | POA: Diagnosis not present

## 2023-09-02 DIAGNOSIS — Z1339 Encounter for screening examination for other mental health and behavioral disorders: Secondary | ICD-10-CM | POA: Diagnosis not present

## 2023-11-30 DIAGNOSIS — R972 Elevated prostate specific antigen [PSA]: Secondary | ICD-10-CM | POA: Diagnosis not present

## 2023-12-07 DIAGNOSIS — E669 Obesity, unspecified: Secondary | ICD-10-CM | POA: Diagnosis not present

## 2023-12-07 DIAGNOSIS — N1831 Chronic kidney disease, stage 3a: Secondary | ICD-10-CM | POA: Diagnosis not present

## 2023-12-07 DIAGNOSIS — I129 Hypertensive chronic kidney disease with stage 1 through stage 4 chronic kidney disease, or unspecified chronic kidney disease: Secondary | ICD-10-CM | POA: Diagnosis not present

## 2023-12-07 DIAGNOSIS — R972 Elevated prostate specific antigen [PSA]: Secondary | ICD-10-CM | POA: Diagnosis not present

## 2023-12-07 DIAGNOSIS — R413 Other amnesia: Secondary | ICD-10-CM | POA: Diagnosis not present

## 2023-12-07 DIAGNOSIS — K219 Gastro-esophageal reflux disease without esophagitis: Secondary | ICD-10-CM | POA: Diagnosis not present

## 2023-12-07 DIAGNOSIS — G4700 Insomnia, unspecified: Secondary | ICD-10-CM | POA: Diagnosis not present

## 2023-12-07 DIAGNOSIS — E785 Hyperlipidemia, unspecified: Secondary | ICD-10-CM | POA: Diagnosis not present

## 2023-12-07 DIAGNOSIS — K635 Polyp of colon: Secondary | ICD-10-CM | POA: Diagnosis not present

## 2023-12-07 DIAGNOSIS — F329 Major depressive disorder, single episode, unspecified: Secondary | ICD-10-CM | POA: Diagnosis not present

## 2024-01-13 DIAGNOSIS — R972 Elevated prostate specific antigen [PSA]: Secondary | ICD-10-CM | POA: Diagnosis not present

## 2024-06-01 DIAGNOSIS — R051 Acute cough: Secondary | ICD-10-CM | POA: Diagnosis not present

## 2024-06-01 DIAGNOSIS — J302 Other seasonal allergic rhinitis: Secondary | ICD-10-CM | POA: Diagnosis not present

## 2024-06-29 DIAGNOSIS — H52223 Regular astigmatism, bilateral: Secondary | ICD-10-CM | POA: Diagnosis not present

## 2024-06-29 DIAGNOSIS — H524 Presbyopia: Secondary | ICD-10-CM | POA: Diagnosis not present

## 2024-06-29 DIAGNOSIS — H25813 Combined forms of age-related cataract, bilateral: Secondary | ICD-10-CM | POA: Diagnosis not present

## 2024-06-29 DIAGNOSIS — H5203 Hypermetropia, bilateral: Secondary | ICD-10-CM | POA: Diagnosis not present
